# Patient Record
Sex: Male | Born: 1935 | Race: White | Hispanic: No | Smoking: Never smoker
Health system: Southern US, Community
[De-identification: ages and names within clinical notes are randomized; demographics above are authoritative.]

## PROBLEM LIST (undated history)

## (undated) DIAGNOSIS — E119 Type 2 diabetes mellitus without complications: Secondary | ICD-10-CM

## (undated) DIAGNOSIS — N400 Enlarged prostate without lower urinary tract symptoms: Secondary | ICD-10-CM

## (undated) DIAGNOSIS — N189 Chronic kidney disease, unspecified: Secondary | ICD-10-CM

## (undated) DIAGNOSIS — Z7902 Long term (current) use of antithrombotics/antiplatelets: Secondary | ICD-10-CM

## (undated) DIAGNOSIS — I10 Essential (primary) hypertension: Secondary | ICD-10-CM

## (undated) DIAGNOSIS — Z87442 Personal history of urinary calculi: Secondary | ICD-10-CM

## (undated) DIAGNOSIS — Z972 Presence of dental prosthetic device (complete) (partial): Secondary | ICD-10-CM

## (undated) DIAGNOSIS — I714 Abdominal aortic aneurysm, without rupture, unspecified: Secondary | ICD-10-CM

## (undated) DIAGNOSIS — R0600 Dyspnea, unspecified: Secondary | ICD-10-CM

## (undated) DIAGNOSIS — K08109 Complete loss of teeth, unspecified cause, unspecified class: Secondary | ICD-10-CM

## (undated) DIAGNOSIS — E785 Hyperlipidemia, unspecified: Secondary | ICD-10-CM

## (undated) DIAGNOSIS — I700 Atherosclerosis of aorta: Secondary | ICD-10-CM

## (undated) HISTORY — PX: CATARACT EXTRACTION W/ INTRAOCULAR LENS  IMPLANT, BILATERAL: SHX1307

## (undated) HISTORY — PX: TONSILLECTOMY: SUR1361

---

## 1958-07-09 HISTORY — PX: TONSILLECTOMY: SUR1361

## 2006-06-24 ENCOUNTER — Ambulatory Visit: Payer: Self-pay | Admitting: Urology

## 2006-07-31 ENCOUNTER — Ambulatory Visit: Payer: Self-pay | Admitting: Urology

## 2008-09-24 ENCOUNTER — Ambulatory Visit: Payer: Self-pay | Admitting: Unknown Physician Specialty

## 2008-10-07 ENCOUNTER — Ambulatory Visit: Payer: Self-pay | Admitting: Unknown Physician Specialty

## 2008-11-06 ENCOUNTER — Ambulatory Visit: Payer: Self-pay | Admitting: Unknown Physician Specialty

## 2008-11-17 ENCOUNTER — Ambulatory Visit: Payer: Self-pay | Admitting: Unknown Physician Specialty

## 2013-01-26 DIAGNOSIS — N138 Other obstructive and reflux uropathy: Secondary | ICD-10-CM | POA: Insufficient documentation

## 2013-01-26 DIAGNOSIS — D075 Carcinoma in situ of prostate: Secondary | ICD-10-CM | POA: Insufficient documentation

## 2013-01-26 DIAGNOSIS — R972 Elevated prostate specific antigen [PSA]: Secondary | ICD-10-CM | POA: Insufficient documentation

## 2014-01-18 DIAGNOSIS — E781 Pure hyperglyceridemia: Secondary | ICD-10-CM

## 2014-01-18 HISTORY — DX: Pure hyperglyceridemia: E78.1

## 2014-07-18 ENCOUNTER — Emergency Department: Payer: Self-pay | Admitting: Emergency Medicine

## 2014-07-21 DIAGNOSIS — N183 Chronic kidney disease, stage 3 unspecified: Secondary | ICD-10-CM | POA: Insufficient documentation

## 2014-07-21 DIAGNOSIS — M1611 Unilateral primary osteoarthritis, right hip: Secondary | ICD-10-CM | POA: Insufficient documentation

## 2015-10-20 DIAGNOSIS — I10 Essential (primary) hypertension: Secondary | ICD-10-CM | POA: Insufficient documentation

## 2016-10-28 DIAGNOSIS — G5603 Carpal tunnel syndrome, bilateral upper limbs: Secondary | ICD-10-CM | POA: Insufficient documentation

## 2016-11-05 ENCOUNTER — Encounter
Admission: RE | Admit: 2016-11-05 | Discharge: 2016-11-05 | Disposition: A | Discharge status: Home / Self Care | Payer: Medicare Other | Source: Ambulatory Visit | Attending: Unknown Physician Specialty | Admitting: Unknown Physician Specialty

## 2016-11-05 DIAGNOSIS — Z01818 Encounter for other preprocedural examination: Secondary | ICD-10-CM | POA: Insufficient documentation

## 2016-11-05 DIAGNOSIS — G5603 Carpal tunnel syndrome, bilateral upper limbs: Secondary | ICD-10-CM | POA: Insufficient documentation

## 2016-11-05 HISTORY — DX: Benign prostatic hyperplasia without lower urinary tract symptoms: N40.0

## 2016-11-05 HISTORY — DX: Essential (primary) hypertension: I10

## 2016-11-05 HISTORY — DX: Chronic kidney disease, unspecified: N18.9

## 2016-11-05 HISTORY — DX: Type 2 diabetes mellitus without complications: E11.9

## 2016-11-05 HISTORY — DX: Dyspnea, unspecified: R06.00

## 2016-11-05 HISTORY — DX: Hyperlipidemia, unspecified: E78.5

## 2016-11-05 NOTE — Pre-Procedure Instructions (Signed)
Clinical History:  83 y.o. year old male recent anginal symptoms  Vitals: Height: 62 in Weight: 170 lb  Cardiac risk factors include:    Hyperlipidemia, Diabetes, HTN and Family Hx CAD       Procedure:    Pharmacologic stress testing was performed with Regadenoson using a single   use 0.4mg /20ml (0.08 mg/ml) prefilled syringe intravenously infused as a   bolus dose. The stress test was stopped due to Infusion completion.Blood   pressure response was normal. The patient did not develop any symptoms   other than fatigue during the procedure.     Rest HR: 68bpm  Rest BP: 150/80mmHg  Max HR: 88bpm  Min BP: 140/12mmHg    Stress Test Administered by: Percell Boston, CMA    ECG Interpretation:  Rest ZOX:WRUEAV sinus rhythm, none  Stress WUJ:WJXBJY sinus rhythm,   Recovery NWG:NFAOZH sinus rhythm  ECG Interpretation:non-diagnostic due to pharmacologic testing.      Administrations This Visit  regadenoson (LEXISCAN) 0.4 mg/5 mL inj syringe 0.4 mg  Admin Date  10/17/2016 Action  Given Dose  0.4 mg Route  Intravenous Administered By  Riley Lam, RT (N)       technetium Tc36m sestamibi (CARDIOLITE) injection 28.68 millicurie  Admin Date  10/17/2016 Action  Given Dose  28.68 millicurie Route  Intravenous Administered By  Titus Dubin, CNMT       technetium Tc58m sestamibi (CARDIOLITE) injection 9.14 millicurie  Admin Date  10/17/2016 Action  Given Dose  9.14 millicurie Route  Intravenous Administered By  Riley Lam, RT (N)             Gated post-stress perfusion imaging was performed 30 minutes after stress.   Rest images were performed 30 minutes after injection.    Gated LV Analysis:   TID Ratio: 1.13    LVEF= 57 %    FINDINGS:  Regional wall motion:reveals normal myocardial thickening and wall   motion.  The overall quality of the study is good.   Artifacts noted: no  Left ventricular cavity: normal.    Perfusion Analysis:SPECT images demonstrate homogeneous tracer   distribution throughout the myocardium.     Back to top of Imaging Results   Echo complete (10/17/2016 8:54 AM) Echo complete (10/17/2016 8:54 AM)  Component Value Ref Range  LV Ejection Fraction (%) 50   Aortic Valve Regurgitation Grade none   Aortic Valve Stenosis Grade none   Aortic Valve Max Velocity (m/s) 1.3 m/sec  Aortic Valve Stenosis Mean Gradient (mmHg) 3.0 mmHg  Mitral Valve Regurgitation Grade trivial   Mitral Valve Stenosis Grade none   Tricuspid Valve Regurgitation Grade mild   Tricuspid Valve Regurgitation Max Velocity (m/s) 2.2 m/sec  Right Ventricle Systolic Pressure (mmHg) 24.7 mmHg  LV End Diastolic Diameter (cm) 4.5 cm  LV End Systolic Diameter (cm) 3.3 cm  LV Septum Wall Thickness (cm) 0.97 cm  LV Posterior Wall Thickness (cm) 0.98 cm  Left Atrium Diameter (cm) 4.3 cm   Echo complete (10/17/2016 8:54 AM)  Specimen Performing Laboratory   [facility] OTHER ORDERS    Echo complete (10/17/2016 8:54 AM)  Narrative  CARDIOLOGY DEPARTMENTFOUST, Rona Ravens, JR.   [facility] [id number]   [facility]: [id number]   [address] [place], [place] [address]NUUV: 10/17/2016 08:09 AM   Adult Male Age: 52 yrs  ECHOCARDIOGRAM REPORTOutpatient   KC::KCWC  STUDY:CHEST WALL TAPE:0000:00: 0:00:00 MD1:CALLWOOD, DWAYNE DENNIS   ECHO:Yes DOPPLER:YesFILE:0000-000-000 BP: 108/58 mmHg  COLOR:YesCONTRAST:No MACHINE:Philips  Height: 62 in  RV BIOPSY:No 3D:NoSOUND  QLTY:ModerateWeight: 170 lb   MEDIUM:None BSA: 1.8 m2    ___________________________________________________________________________________________   HISTORY:DOE, Chest pain  REASON:Assess, LV function  INDICATION:R06.02 Shortness of breath, I20.8 Other forms of angina pectoris  ___________________________________________________________________________________________    ECHOCARDIOGRAPHIC MEASUREMENTS  2D DIMENSIONS  AORTA ValuesNormal RangeMAIN PAValuesNormal Range  Annulus:nm* [2.3 - 2.9]PA Main:nm* [1.5 - 2.1]  Aorta Sin:nm* [3.1 - 3.7] RIGHT VENTRICLE  ST Junction:nm* [2.6 - 3.2]RV Base:3.6 cm[ < 4.2]  Asc.Aorta:nm* [2.6 - 3.4] RV Mid:nm* [ < 3.5]  LEFT VENTRICLERV Length:nm* [ < 8.6]  LVIDd:4.5 cm[4.2 - 5.9] INFERIOR VENA CAVA  LVIDs:3.3 cmMax. IVC:nm* [ <= 2.1]   FS:27.6 %[> 25]Min. IVC:nm*  SWT:0.97 cm [0.6 - 1.0] ------------------  PWT:0.98 cm [0.6 - 1.0] nm* - not measured  LEFT ATRIUM  LA Diam:4.3 cm[3.0 - 4.0]  LA A4C Area:nm* [ < 20]  LA Volume:nm* Vera.August - 58]  ___________________________________________________________________________________________    ECHOCARDIOGRAPHIC DESCRIPTIONS    AORTIC ROOT  Size:Normal  Dissection:INDETERM FOR DISSECTION    AORTIC VALVE  Leaflets:TricuspidMorphology:MILDLY THICKENED  Mobility:Fully mobile    LEFT VENTRICLE  Size:Normal  Anterior:Normal  Contraction:NormalLateral:Normal  Closest EF:50% (Estimated)Septal:Normal   LV Masses:No MassesApical:Normal   XBJ:YNWG Inferior:Normal   Posterior:Normal  Dias.FxClass:(Grade 1) relaxation abnormal, E/A reversal    MITRAL VALVE  Leaflets:Normal Mobility:Fully mobile  Morphology:Normal    LEFT ATRIUM  Size:MILDLY ENLARGED LA Masses:No masses   IA Septum:Normal IAS    MAIN PA  Size:Normal    PULMONIC VALVE  Morphology:Normal Mobility:Fully mobile    RIGHT VENTRICLE   RV Masses:No MassesSize:Normal   Free Wall:NormalContraction:Normal    TRICUSPID VALVE  Leaflets:Normal Mobility:Fully mobile  Morphology:Normal    RIGHT ATRIUM  Size:MILDLY ENLARGEDRA Other:None   RA Mass:No masses    PERICARDIUM   Fluid:No effusion    INFERIOR VENACAVA  Size:Normal Normal respiratory collapse    ____________________________________________________________________    DOPPLER ECHO and OTHER SPECIAL PROCEDURES   Aortic:No AR No AS  127.0 cm/sec peak vel 6.5 mmHg peak grad  3.0 mmHg mean grad2.2 cm^2 by DOPPLER     Mitral:TRIVIAL MRNo MS  3.4 cm^2 by DOPPLER  MV Inflow E Vel=51.8 cm/sec MV Annulus E'Vel=4.6 cm/sec  E/E'Ratio=11.3    Tricuspid:MILD TR No TS  222.0 cm/sec peak TR vel24.7 mmHg peak RV pressure    Pulmonary:TRIVIAL PRNo PS        ___________________________________________________________________________________________    INTERPRETATION  NORMAL LEFT VENTRICULAR SYSTOLIC  FUNCTION WITH AN ESTIMATED EF = 50-55 %  NORMAL RIGHT VENTRICULAR SYSTOLIC FUNCTION  MILD TRICUSPID VALVE INSUFFICIENCY  TRACE MITRAL VALVE INSUFFICIENCY  NO VALVULAR STENOSIS  MILD BIATRIAL ENLARGEMENT    ___________________________________________________________________________________________    Electronically signed by: Dorothyann Peng, MD on 10/18/2016 10:04 AM  Performed By: Luretha Murphy, RDCS, RVT  Ordering Physician: Dorothyann Peng  ___________________________________________________________________________________________    Echo complete (10/17/2016 8:54 AM)  Procedure Note  Interface, Text Results In - 10/18/2016 10:04 AM EDT  CARDIOLOGY DEPARTMENT Lomba, Rona Ravens, JR. [facility] CLINIC [id number] [facility] Acct #: [id number] [address] [place], [place] [address] Date: 10/17/2016 08:09 AM Adult Male Age: 45 yrs ECHOCARDIOGRAM REPORT Outpatient KC::KCWC STUDY:CHEST WALL TAPE:0000:00: 0:00:00 MD1: CALLWOOD, DWAYNE DENNIS ECHO:Yes DOPPLER:Yes FILE:0000-000-000 BP: 108/58 mmHg COLOR:Yes CONTRAST:No MACHINE:Philips Height: 62 in RV BIOPSY:No 3D:No SOUND QLTY:Moderate Weight: 170 lb MEDIUM:None BSA: 1.8 m2  ___________________________________________________________________________________________ HISTORY:DOE, Chest pain REASON:Assess, LV function INDICATION:R06.02 Shortness of breath, I20.8 Other forms of angina pectoris ___________________________________________________________________________________________  ECHOCARDIOGRAPHIC MEASUREMENTS  2D DIMENSIONS AORTA Values Normal Range MAIN PA Values  Normal Range Annulus: nm* [2.3 - 2.9] PA Main: nm*  [1.5 - 2.1] Aorta Sin: nm* [3.1 - 3.7] RIGHT VENTRICLE ST Junction: nm* [2.6 - 3.2] RV Base: 3.6 cm  [ < 4.2] Asc.Aorta: nm* [2.6 - 3.4] RV Mid: nm*  [ < 3.5] LEFT VENTRICLE RV Length: nm*  [ < 8.6] LVIDd: 4.5 cm [4.2 - 5.9] INFERIOR VENA CAVA LVIDs: 3.3 cm Max. IVC:  nm*  [ <= 2.1] FS: 27.6 % [> 25] Min. IVC: nm* SWT: 0.97 cm [0.6 - 1.0] ------------------ PWT: 0.98 cm [0.6 - 1.0] nm* - not measured LEFT ATRIUM LA Diam: 4.3 cm [3.0 - 4.0] LA A4C Area: nm* [ < 20] LA Volume: nm* [18 - 58] ___________________________________________________________________________________________  ECHOCARDIOGRAPHIC DESCRIPTIONS  AORTIC ROOT Size:Normal Dissection:INDETERM FOR DISSECTION  AORTIC VALVE Leaflets:Tricuspid Morphology:MILDLY THICKENED Mobility:Fully mobile  LEFT VENTRICLE Size:Normal Anterior:Normal Contraction:Normal Lateral:Normal Closest EF:50% (Estimated) Septal:Normal LV Masses:No Masses Apical:Normal ZOX:WRUE Inferior:Normal Posterior:Normal Dias.FxClass:(Grade 1) relaxation abnormal, E/A reversal  MITRAL VALVE Leaflets:Normal Mobility:Fully mobile Morphology:Normal  LEFT ATRIUM Size:MILDLY ENLARGED LA Masses:No masses IA Septum:Normal IAS  MAIN PA Size:Normal  PULMONIC VALVE Morphology:Normal Mobility:Fully mobile  RIGHT VENTRICLE RV Masses:No Masses Size:Normal Free Wall:Normal Contraction:Normal  TRICUSPID VALVE Leaflets:Normal Mobility:Fully mobile Morphology:Normal  RIGHT ATRIUM Size:MILDLY ENLARGED RA Other:None RA Mass:No masses  PERICARDIUM Fluid:No effusion  INFERIOR VENACAVA Size:Normal Normal respiratory collapse  ____________________________________________________________________  DOPPLER ECHO and OTHER SPECIAL PROCEDURES Aortic:No AR No AS 127.0 cm/sec peak vel 6.5 mmHg peak grad 3.0 mmHg mean grad 2.2 cm^2 by DOPPLER  Mitral:TRIVIAL MR No MS 3.4 cm^2 by DOPPLER MV Inflow E Vel=51.8 cm/sec MV Annulus E'Vel=4.6 cm/sec E/E'Ratio=11.3  Tricuspid:MILD TR No TS 222.0 cm/sec peak TR vel 24.7 mmHg peak RV pressure  Pulmonary:TRIVIAL PR No PS    ___________________________________________________________________________________________  INTERPRETATION NORMAL LEFT VENTRICULAR SYSTOLIC FUNCTION  WITH AN ESTIMATED EF = 50-55 % NORMAL RIGHT VENTRICULAR SYSTOLIC FUNCTION MILD TRICUSPID VALVE INSUFFICIENCY TRACE MITRAL VALVE INSUFFICIENCY NO VALVULAR STENOSIS MILD BIATRIAL ENLARGEMENT  ___________________________________________________________________________________________  Electronically signed by: Dorothyann Peng, MD on 10/18/2016 10:04 AM Performed By: Luretha Murphy, RDCS, RVT Ordering Physician: Dorothyann Peng ___________________________________________________________________________________________   Back to top of Imaging Results

## 2016-11-05 NOTE — Patient Instructions (Signed)
Your procedure is scheduled on: 11/12/16 Mon Report to Same Day Surgery 2nd floor medical mall [facility] Entrance-take elevator on left to 2nd floor.  Check in with surgery information desk.) To find out your arrival time please call [phone] between 1PM - 3PM on 11/09/16 Fri  Remember: Instructions that are not followed completely may result in serious medical risk, up to and including death, or upon the discretion of your surgeon and anesthesiologist your surgery may need to be rescheduled.    _x___ 1. Do not eat food or drink liquids after midnight. No gum chewing or                              hard candies.     __x__ 2. No Alcohol for 24 hours before or after surgery.   __x__3. No Smoking for 24 prior to surgery.   ____  4. Bring all medications with you on the day of surgery if instructed.    __x__ 5. Notify your doctor if there is any change in your medical condition     (cold, fever, infections).     Do not wear jewelry, make-up, hairpins, clips or nail polish.  Do not wear lotions, powders, or perfumes. You may wear deodorant.  Do not shave 48 hours prior to surgery. Men may shave face and neck.  Do not bring valuables to the hospital.    [facility] is not responsible for any belongings or valuables.               Contacts, dentures or bridgework may not be worn into surgery.  Leave your suitcase in the car. After surgery it may be brought to your room.  For patients admitted to the hospital, discharge time is determined by your                       treatment team.   Patients discharged the day of surgery will not be allowed to drive home.  You will need someone to drive you home and stay with you the night of your procedure.    Please read over the following fact sheets that you were given:   [facility] Preparing for Surgery and or MRSA Information   _x___ Take anti-hypertensive (unless it includes a diuretic), cardiac, seizure, asthma,     anti-reflux and  psychiatric medicines. These include:  1. lisinopril (PRINIVIL,ZESTRIL  2.fenofibrate (TRICOR  3.  4.  5.  6.  ____Fleets enema or Magnesium Citrate as directed.   _x___ Use CHG Soap or sage wipes as directed on instruction sheet   ____ Use inhalers on the day of surgery and bring to hospital day of surgery  ____ Stop Metformin and Janumet 2 days prior to surgery.    ____ Take 1/2 of usual insulin dose the night before surgery and none on the morning     surgery.   _x___ Follow recommendations from Cardiologist, Pulmonologist or PCP regarding          stopping Aspirin, Coumadin, Pllavix ,Eliquis, Effient, or Pradaxa, and Pletal.  X____Stop Anti-inflammatories such as Advil, Aleve, Ibuprofen, Motrin, Naproxen, Naprosyn, Goodies powders or aspirin products. OK to take Tylenol and                          Celebrex.   _x___ Stop supplements until after surgery.  But may continue Vitamin D,  Vitamin B,       and multivitamin.   ____ Bring C-Pap to the hospital.

## 2016-11-12 ENCOUNTER — Ambulatory Visit: Payer: Medicare Other | Admitting: Anesthesiology

## 2016-11-12 ENCOUNTER — Encounter: Payer: Self-pay | Admitting: *Deleted

## 2016-11-12 ENCOUNTER — Ambulatory Visit
Admission: RE | Admit: 2016-11-12 | Discharge: 2016-11-12 | Disposition: A | Discharge status: Home / Self Care | Payer: Medicare Other | Source: Ambulatory Visit | Attending: Unknown Physician Specialty | Admitting: Unknown Physician Specialty

## 2016-11-12 ENCOUNTER — Encounter
Admission: RE | Disposition: A | Discharge status: Home / Self Care | Payer: Self-pay | Source: Ambulatory Visit | Attending: Unknown Physician Specialty

## 2016-11-12 DIAGNOSIS — Z86010 Personal history of colonic polyps: Secondary | ICD-10-CM | POA: Diagnosis not present

## 2016-11-12 DIAGNOSIS — I10 Essential (primary) hypertension: Secondary | ICD-10-CM | POA: Diagnosis not present

## 2016-11-12 DIAGNOSIS — R0602 Shortness of breath: Secondary | ICD-10-CM | POA: Diagnosis not present

## 2016-11-12 DIAGNOSIS — Z87442 Personal history of urinary calculi: Secondary | ICD-10-CM | POA: Diagnosis not present

## 2016-11-12 DIAGNOSIS — Z800 Family history of malignant neoplasm of digestive organs: Secondary | ICD-10-CM | POA: Insufficient documentation

## 2016-11-12 DIAGNOSIS — G5601 Carpal tunnel syndrome, right upper limb: Secondary | ICD-10-CM | POA: Diagnosis not present

## 2016-11-12 DIAGNOSIS — Z7982 Long term (current) use of aspirin: Secondary | ICD-10-CM | POA: Insufficient documentation

## 2016-11-12 DIAGNOSIS — Z79899 Other long term (current) drug therapy: Secondary | ICD-10-CM | POA: Diagnosis not present

## 2016-11-12 DIAGNOSIS — N400 Enlarged prostate without lower urinary tract symptoms: Secondary | ICD-10-CM | POA: Diagnosis not present

## 2016-11-12 DIAGNOSIS — Z8249 Family history of ischemic heart disease and other diseases of the circulatory system: Secondary | ICD-10-CM | POA: Diagnosis not present

## 2016-11-12 DIAGNOSIS — E118 Type 2 diabetes mellitus with unspecified complications: Secondary | ICD-10-CM | POA: Diagnosis not present

## 2016-11-12 HISTORY — PX: CARPAL TUNNEL RELEASE: SHX101

## 2016-11-12 LAB — GLUCOSE, CAPILLARY
GLUCOSE-CAPILLARY: 204 mg/dL — AB (ref 65–99)
Glucose-Capillary: 154 mg/dL — ABNORMAL HIGH (ref 65–99)

## 2016-11-12 SURGERY — CARPAL TUNNEL RELEASE
Anesthesia: General | Laterality: Right | Wound class: Clean

## 2016-11-12 MED ORDER — PROPOFOL 500 MG/50ML IV EMUL
INTRAVENOUS | Status: AC
  Filled 2016-11-12: qty 50

## 2016-11-12 MED ORDER — FENTANYL CITRATE (PF) 100 MCG/2ML IJ SOLN: 25.0000 ug | INTRAMUSCULAR | Status: DC | PRN

## 2016-11-12 MED ORDER — FAMOTIDINE 20 MG PO TABS
ORAL_TABLET | ORAL | Status: AC
  Administered 2016-11-12: 20 mg via ORAL
  Filled 2016-11-12: qty 1

## 2016-11-12 MED ORDER — SODIUM CHLORIDE 0.9 % IV SOLN
INTRAVENOUS | Status: DC
  Administered 2016-11-12: 09:00:00 via INTRAVENOUS

## 2016-11-12 MED ORDER — NORCO 5-325 MG PO TABS: 1.0000 | ORAL_TABLET | Freq: Four times a day (QID) | ORAL | 0 refills | Status: DC | PRN

## 2016-11-12 MED ORDER — ONDANSETRON HCL 4 MG/2ML IJ SOLN: 4.0000 mg | Freq: Once | INTRAMUSCULAR | Status: DC | PRN

## 2016-11-12 MED ORDER — ONDANSETRON HCL 4 MG/2ML IJ SOLN
INTRAMUSCULAR | Status: DC | PRN
Start: 2016-11-12 — End: 2016-11-12
  Administered 2016-11-12: 4 mg via INTRAVENOUS

## 2016-11-12 MED ORDER — FAMOTIDINE 20 MG PO TABS
20.0000 mg | ORAL_TABLET | Freq: Once | ORAL | Status: AC
  Administered 2016-11-12: 20 mg via ORAL

## 2016-11-12 MED ORDER — BUPIVACAINE HCL (PF) 0.5 % IJ SOLN
INTRAMUSCULAR | Status: DC | PRN
  Administered 2016-11-12: 4 mL

## 2016-11-12 MED ORDER — PROPOFOL 10 MG/ML IV BOLUS
INTRAVENOUS | Status: DC | PRN
  Administered 2016-11-12: 150 mg via INTRAVENOUS

## 2016-11-12 MED ORDER — FENTANYL CITRATE (PF) 100 MCG/2ML IJ SOLN
INTRAMUSCULAR | Status: AC
  Filled 2016-11-12: qty 2

## 2016-11-12 MED ORDER — KETOROLAC TROMETHAMINE 30 MG/ML IJ SOLN
INTRAMUSCULAR | Status: AC
  Filled 2016-11-12: qty 1

## 2016-11-12 MED ORDER — PHENYLEPHRINE HCL 10 MG/ML IJ SOLN
INTRAMUSCULAR | Status: DC | PRN
  Administered 2016-11-12 (×4): 100 ug via INTRAVENOUS

## 2016-11-12 MED ORDER — ONDANSETRON HCL 4 MG/2ML IJ SOLN
INTRAMUSCULAR | Status: AC
  Filled 2016-11-12: qty 2

## 2016-11-12 MED ORDER — KETOROLAC TROMETHAMINE 30 MG/ML IJ SOLN
INTRAMUSCULAR | Status: DC | PRN
  Administered 2016-11-12: 30 mg via INTRAVENOUS

## 2016-11-12 MED ORDER — FENTANYL CITRATE (PF) 100 MCG/2ML IJ SOLN
INTRAMUSCULAR | Status: DC | PRN
  Administered 2016-11-12: 25 ug via INTRAVENOUS
  Administered 2016-11-12: 50 ug via INTRAVENOUS
  Administered 2016-11-12: 25 ug via INTRAVENOUS

## 2016-11-12 SURGICAL SUPPLY — 25 items
BANDAGE ELASTIC 2 LF NS (GAUZE/BANDAGES/DRESSINGS) ×3 IMPLANT
BNDG ESMARK 4X12 TAN STRL LF (GAUZE/BANDAGES/DRESSINGS) ×3 IMPLANT
CHLORAPREP W/TINT 26ML (MISCELLANEOUS) ×3 IMPLANT
CUFF TOURN 18 STER (MISCELLANEOUS) ×3 IMPLANT
ELECT REM PT RETURN 9FT ADLT (ELECTROSURGICAL) ×3
ELECTRODE REM PT RTRN 9FT ADLT (ELECTROSURGICAL) ×1 IMPLANT
GAUZE SPONGE 4X4 12PLY STRL (GAUZE/BANDAGES/DRESSINGS) ×3 IMPLANT
GLOVE BIO SURGEON STRL SZ7.5 (GLOVE) ×3 IMPLANT
GLOVE BIO SURGEON STRL SZ8 (GLOVE) ×6 IMPLANT
GLOVE BIOGEL M STRL SZ7.5 (GLOVE) ×3 IMPLANT
GLOVE INDICATOR 8.0 STRL GRN (GLOVE) ×3 IMPLANT
GOWN STRL REUS W/ TWL LRG LVL3 (GOWN DISPOSABLE) ×2 IMPLANT
GOWN STRL REUS W/TWL LRG LVL3 (GOWN DISPOSABLE) ×4
KIT RM TURNOVER STRD PROC AR (KITS) ×3 IMPLANT
NS IRRIG 500ML POUR BTL (IV SOLUTION) ×3 IMPLANT
PACK EXTREMITY ARMC (MISCELLANEOUS) ×3 IMPLANT
PADDING CAST 2X4YD ST (MISCELLANEOUS) ×2
PADDING CAST BLEND 2X4 STRL (MISCELLANEOUS) ×1 IMPLANT
SOL PREP PVP 2OZ (MISCELLANEOUS) ×3
SOLUTION PREP PVP 2OZ (MISCELLANEOUS) ×1 IMPLANT
SPLINT CAST 1 STEP 3X12 (MISCELLANEOUS) ×3 IMPLANT
STOCKINETTE STRL 4IN 9604848 (GAUZE/BANDAGES/DRESSINGS) ×3 IMPLANT
SUT ETHILON 4-0 (SUTURE) ×2
SUT ETHILON 4-0 FS2 18XMFL BLK (SUTURE) ×1
SUTURE ETHLN 4-0 FS2 18XMF BLK (SUTURE) ×1 IMPLANT

## 2016-11-12 NOTE — Discharge Instructions (Signed)
Elevation  Ice pack  RTC in about 2 weeks     AMBULATORY SURGERY  DISCHARGE INSTRUCTIONS   1) The drugs that you were given will stay in your system until tomorrow so for the next 24 hours you should not:  A) Drive an automobile B) Make any legal decisions C) Drink any alcoholic beverage   2) You may resume regular meals tomorrow.  Today it is better to start with liquids and gradually work up to solid foods.  You may eat anything you prefer, but it is better to start with liquids, then soup and crackers, and gradually work up to solid foods.   3) Please notify your doctor immediately if you have any unusual bleeding, trouble breathing, redness and pain at the surgery site, drainage, fever, or pain not relieved by medication.    4) Additional Instructions:        Please contact your physician with any problems or Same Day Surgery at [phone], Monday through Friday 6 am to 4 pm, or [place] [facility] Main number at [phone].

## 2016-11-12 NOTE — Op Note (Signed)
DATE OF SURGERY:  11/12/2016  PATIENT NAME:  Carlos [name] Couchman Jr.   DOB: Apr 26, 1931  MRN: [id number]  PRE-OPERATIVE DIAGNOSIS: Right carpal tunnel syndrome  POST-OPERATIVE DIAGNOSIS:  Same  PROCEDURE: Right carpal tunnel release  SURGEON: Dr. Erin SonsHarold Annison Birchard, Montez HagemanJr. M.D.  ANESTHESIA: Gen.   INDICATIONS FOR SURGERY: Carlos [name] Nolley Jr. is [name] 83 y.o. year old male with [name] long history of numbness and paresthesias in the right hand. Nerve conduction studies demonstrated findings consistent with severe  median nerve compression.The patient had not seen any significant improvement despite conservative nonsurgical intervention. After discussion of the risks and benefits of surgical intervention, the patient expressed understanding of the risks benefits and agreed with plans for carpal tunnel release.   PROCEDURE IN DETAIL: The patient was taken the operating room where satisfactory general anesthesia was achieved. [name] tourniquet was placed on the patient's right upper arm.The right hand and arm were prepped  and draped in the usual sterile fashion. [name] "time-out" was performed as per usual protocol. The hand and forearm were exsanguinated using an Esmarch and the tourniquet was inflated to 250 mmHg.  An incision was made just ulnar to the thenar palmar crease. Dissection was carried down through the palmar fascia to the transverse carpal ligament. The transverse carpal ligament was sharply incised, taking care to protect the underlying structures within the carpal tunnel. Complete release of the transverse carpal ligament was achieved. There was no evidence of [name] mass or proliferative synovitis within the carpal tunnel. The median nerve did appear to be slightly flattened. The wound was irrigated with saline. The tourniquet was released at this time. It had been up for about 8 minutes. Bleeding was controlled with digital pressure and coagulation cautery. I did inject the subcutaneous tissue of the wound with about  2-3 cc of 0.5% Marcaine without epinephrine. The skin was then re-approximated with interrupted sutures of #4-0 nylon. [name] sterile dressing was applied followed by application of [name] volar splint.  The patient was awakened and transferred to [name] stretcher bed.  The patient tolerated the procedure well and was transported to the PACU in stable condition. Blood loss was negligible.  Dr. Isidoro DonningHarold Crystol Walpole, Jr. M.D.

## 2016-11-12 NOTE — Anesthesia Postprocedure Evaluation (Signed)
Anesthesia Post Note  Patient: Carlos [name] A Spoelstra Jr.  Procedure(s) Performed: Procedure(s) (LRB): CARPAL TUNNEL RELEASE (Right)  Patient location during evaluation: PACU Anesthesia Type: General Level of consciousness: awake and alert Pain management: pain level controlled Vital Signs Assessment: post-procedure vital signs reviewed and stable Respiratory status: spontaneous breathing, nonlabored ventilation, respiratory function stable and patient connected to nasal cannula oxygen Cardiovascular status: blood pressure returned to baseline and stable Postop Assessment: no signs of nausea or vomiting Anesthetic complications: no     Last Vitals:  Vitals:   11/12/16 1054 11/12/16 1110  BP: 127/65 128/63  Pulse: (!) 56   Resp: 18   Temp: 36.3 C     Last Pain:  Vitals:   11/12/16 1020  TempSrc:   PainSc: Asleep                 [name]

## 2016-11-12 NOTE — Transfer of Care (Signed)
Immediate Anesthesia Transfer of Care Note  Patient: Carlos GoodellKerston A Bentley Jr.  Procedure(s) Performed: Procedure(s): CARPAL TUNNEL RELEASE (Right)  Patient Location: PACU  Anesthesia Type:General  Level of Consciousness: sedated and responds to stimulation  Airway & Oxygen Therapy: Patient Spontanous Breathing and Patient connected to face mask oxygen  Post-op Assessment: Report given to RN and Post -op Vital signs reviewed and stable  Post vital signs: Reviewed and stable  Last Vitals:  Vitals:   11/12/16 0819 11/12/16 1020  BP: 134/75 (!) 104/58  Pulse: 78 (!) 54  Resp: 20 12  Temp: 37 C 36.4 C    Last Pain:  Vitals:   11/12/16 1020  TempSrc:   PainSc: Asleep         Complications: No apparent anesthesia complications

## 2016-11-12 NOTE — Anesthesia Procedure Notes (Signed)
Procedure Name: LMA Insertion Date/Time: 11/12/2016 9:29 AM Performed by: Omer JackWEATHERLY, Mirranda Monrroy Pre-anesthesia Checklist: Patient identified, Patient being monitored, Timeout performed, Emergency Drugs available and Suction available Patient Re-evaluated:Patient Re-evaluated prior to inductionOxygen Delivery Method: Circle system utilized Preoxygenation: Pre-oxygenation with 100% oxygen Intubation Type: IV induction Ventilation: Mask ventilation without difficulty LMA: LMA inserted LMA Size: 4.0 Tube type: Oral Number of attempts: 1 Placement Confirmation: positive ETCO2 and breath sounds checked- equal and bilateral Tube secured with: Tape Dental Injury: Teeth and Oropharynx as per pre-operative assessment

## 2016-11-12 NOTE — Anesthesia Preprocedure Evaluation (Signed)
Anesthesia Evaluation  Patient identified by MRN, date of birth, ID band Patient awake    Reviewed: Allergy & Precautions, NPO status , Patient's Chart, lab work & pertinent test results, reviewed documented beta blocker date and time   Airway Mallampati: III  TM Distance: >3 FB     Dental  (+) Chipped   Pulmonary shortness of breath,           Cardiovascular hypertension, Pt. on medications      Neuro/Psych    GI/Hepatic   Endo/Other  diabetes, Type 2  Renal/GU Renal InsufficiencyRenal disease     Musculoskeletal   Abdominal   Peds  Hematology   Anesthesia Other Findings   Reproductive/Obstetrics                             Anesthesia Physical Anesthesia Plan  ASA: III  Anesthesia Plan: General   Post-op Pain Management:    Induction: Intravenous  Airway Management Planned: LMA  Additional Equipment:   Intra-op Plan:   Post-operative Plan:   Informed Consent: I have reviewed the patients History and Physical, chart, labs and discussed the procedure including the risks, benefits and alternatives for the proposed anesthesia with the patient or authorized representative who has indicated his/her understanding and acceptance.     Plan Discussed with: CRNA  Anesthesia Plan Comments:         Anesthesia Quick Evaluation

## 2016-11-12 NOTE — H&P (Signed)
  H and P reviewed. No changes. Uploaded at later date. 

## 2016-11-12 NOTE — Anesthesia Post-op Follow-up Note (Cosign Needed)
Anesthesia QCDR form completed.        

## 2017-08-05 ENCOUNTER — Other Ambulatory Visit: Payer: Self-pay

## 2017-08-05 ENCOUNTER — Encounter: Payer: Self-pay | Admitting: *Deleted

## 2017-08-09 ENCOUNTER — Encounter
Admission: RE | Disposition: A | Discharge status: Home / Self Care | Payer: Self-pay | Source: Ambulatory Visit | Attending: Unknown Physician Specialty

## 2017-08-09 ENCOUNTER — Ambulatory Visit: Payer: Medicare Other | Admitting: Anesthesiology

## 2017-08-09 ENCOUNTER — Ambulatory Visit
Admission: RE | Admit: 2017-08-09 | Discharge: 2017-08-09 | Disposition: A | Discharge status: Home / Self Care | Payer: Medicare Other | Source: Ambulatory Visit | Attending: Unknown Physician Specialty | Admitting: Unknown Physician Specialty

## 2017-08-09 DIAGNOSIS — M1990 Unspecified osteoarthritis, unspecified site: Secondary | ICD-10-CM | POA: Insufficient documentation

## 2017-08-09 DIAGNOSIS — Z9842 Cataract extraction status, left eye: Secondary | ICD-10-CM | POA: Diagnosis not present

## 2017-08-09 DIAGNOSIS — G5602 Carpal tunnel syndrome, left upper limb: Secondary | ICD-10-CM | POA: Diagnosis present

## 2017-08-09 DIAGNOSIS — E1122 Type 2 diabetes mellitus with diabetic chronic kidney disease: Secondary | ICD-10-CM | POA: Diagnosis not present

## 2017-08-09 DIAGNOSIS — Z7982 Long term (current) use of aspirin: Secondary | ICD-10-CM | POA: Diagnosis not present

## 2017-08-09 DIAGNOSIS — Z86010 Personal history of colonic polyps: Secondary | ICD-10-CM | POA: Diagnosis not present

## 2017-08-09 DIAGNOSIS — Z87442 Personal history of urinary calculi: Secondary | ICD-10-CM | POA: Diagnosis not present

## 2017-08-09 DIAGNOSIS — Z800 Family history of malignant neoplasm of digestive organs: Secondary | ICD-10-CM | POA: Diagnosis not present

## 2017-08-09 DIAGNOSIS — Z79899 Other long term (current) drug therapy: Secondary | ICD-10-CM | POA: Diagnosis not present

## 2017-08-09 DIAGNOSIS — N400 Enlarged prostate without lower urinary tract symptoms: Secondary | ICD-10-CM | POA: Diagnosis not present

## 2017-08-09 DIAGNOSIS — N183 Chronic kidney disease, stage 3 (moderate): Secondary | ICD-10-CM | POA: Insufficient documentation

## 2017-08-09 DIAGNOSIS — Z7984 Long term (current) use of oral hypoglycemic drugs: Secondary | ICD-10-CM | POA: Insufficient documentation

## 2017-08-09 DIAGNOSIS — E781 Pure hyperglyceridemia: Secondary | ICD-10-CM | POA: Diagnosis not present

## 2017-08-09 DIAGNOSIS — Z9889 Other specified postprocedural states: Secondary | ICD-10-CM | POA: Diagnosis not present

## 2017-08-09 DIAGNOSIS — Z8249 Family history of ischemic heart disease and other diseases of the circulatory system: Secondary | ICD-10-CM | POA: Insufficient documentation

## 2017-08-09 DIAGNOSIS — I129 Hypertensive chronic kidney disease with stage 1 through stage 4 chronic kidney disease, or unspecified chronic kidney disease: Secondary | ICD-10-CM | POA: Insufficient documentation

## 2017-08-09 HISTORY — PX: CARPAL TUNNEL RELEASE: SHX101

## 2017-08-09 HISTORY — DX: Presence of dental prosthetic device (complete) (partial): Z97.2

## 2017-08-09 LAB — GLUCOSE, CAPILLARY
GLUCOSE-CAPILLARY: 192 mg/dL — AB (ref 65–99)
Glucose-Capillary: 152 mg/dL — ABNORMAL HIGH (ref 65–99)

## 2017-08-09 SURGERY — CARPAL TUNNEL RELEASE
Anesthesia: General | Site: Hand | Laterality: Left | Wound class: Clean

## 2017-08-09 MED ORDER — OXYCODONE HCL 5 MG PO TABS: 5.0000 mg | ORAL_TABLET | Freq: Once | ORAL | Status: DC | PRN

## 2017-08-09 MED ORDER — OXYCODONE HCL 5 MG/5ML PO SOLN: 5.0000 mg | Freq: Once | ORAL | Status: DC | PRN

## 2017-08-09 MED ORDER — BUPIVACAINE HCL (PF) 0.5 % IJ SOLN
INTRAMUSCULAR | Status: DC | PRN
  Administered 2017-08-09: 6 mL

## 2017-08-09 MED ORDER — ONDANSETRON HCL 4 MG/2ML IJ SOLN
INTRAMUSCULAR | Status: DC | PRN
  Administered 2017-08-09: 4 mg via INTRAVENOUS

## 2017-08-09 MED ORDER — LIDOCAINE HCL (CARDIAC) 20 MG/ML IV SOLN
INTRAVENOUS | Status: DC | PRN
  Administered 2017-08-09: 20 mg via INTRATRACHEAL

## 2017-08-09 MED ORDER — LACTATED RINGERS IV SOLN
INTRAVENOUS | Status: DC
  Administered 2017-08-09: 08:00:00 via INTRAVENOUS

## 2017-08-09 MED ORDER — FENTANYL CITRATE (PF) 100 MCG/2ML IJ SOLN
INTRAMUSCULAR | Status: DC | PRN
  Administered 2017-08-09 (×2): 50 ug via INTRAVENOUS

## 2017-08-09 MED ORDER — PHENYLEPHRINE HCL 10 MG/ML IJ SOLN
INTRAMUSCULAR | Status: DC | PRN
  Administered 2017-08-09 (×3): 100 ug via INTRAVENOUS

## 2017-08-09 MED ORDER — NORCO 5-325 MG PO TABS: 1.0000 | ORAL_TABLET | Freq: Four times a day (QID) | ORAL | 0 refills | Status: DC | PRN

## 2017-08-09 MED ORDER — GLYCOPYRROLATE 0.2 MG/ML IJ SOLN
INTRAMUSCULAR | Status: DC | PRN
  Administered 2017-08-09: 0.1 mg via INTRAVENOUS

## 2017-08-09 MED ORDER — PROPOFOL 10 MG/ML IV BOLUS
INTRAVENOUS | Status: DC | PRN
  Administered 2017-08-09: 100 mg via INTRAVENOUS

## 2017-08-09 MED ORDER — DEXMEDETOMIDINE HCL 200 MCG/2ML IV SOLN
INTRAVENOUS | Status: DC | PRN
  Administered 2017-08-09 (×2): 4 ug via INTRAVENOUS

## 2017-08-09 MED ORDER — LACTATED RINGERS IV SOLN
INTRAVENOUS | Status: DC
  Administered 2017-08-09: 07:00:00 via INTRAVENOUS

## 2017-08-09 SURGICAL SUPPLY — 29 items
BANDAGE ELASTIC 2 LF NS (GAUZE/BANDAGES/DRESSINGS) ×3 IMPLANT
BNDG ESMARK 4X12 TAN STRL LF (GAUZE/BANDAGES/DRESSINGS) ×3 IMPLANT
BNDG STRETCH 4X75 STRL LF (GAUZE/BANDAGES/DRESSINGS) ×3 IMPLANT
COVER LIGHT HANDLE FLEXIBLE (MISCELLANEOUS) ×6 IMPLANT
CUFF TOURN SGL QUICK 18 (TOURNIQUET CUFF) ×3 IMPLANT
DURAPREP 26ML APPLICATOR (WOUND CARE) ×3 IMPLANT
ELECT REM PT RETURN 9FT ADLT (ELECTROSURGICAL) ×3
ELECTRODE REM PT RTRN 9FT ADLT (ELECTROSURGICAL) ×1 IMPLANT
GAUZE SPONGE 4X4 12PLY STRL (GAUZE/BANDAGES/DRESSINGS) ×3 IMPLANT
GLOVE BIO SURGEON STRL SZ7.5 (GLOVE) ×3 IMPLANT
GLOVE BIO SURGEON STRL SZ8 (GLOVE) ×9 IMPLANT
GLOVE INDICATOR 8.0 STRL GRN (GLOVE) ×6 IMPLANT
GOWN STRL REUS W/ TWL LRG LVL3 (GOWN DISPOSABLE) ×2 IMPLANT
GOWN STRL REUS W/TWL LRG LVL3 (GOWN DISPOSABLE) ×4
KIT RM TURNOVER STRD PROC AR (KITS) ×3 IMPLANT
LOOP VESSEL RED MINI 1.3X0.9 (MISCELLANEOUS) ×1 IMPLANT
LOOPS RED MINI 1.3MMX0.9MM (MISCELLANEOUS) ×2
NS IRRIG 500ML POUR BTL (IV SOLUTION) ×3 IMPLANT
PACK EXTREMITY ARMC (MISCELLANEOUS) ×3 IMPLANT
PADDING CAST 2X4YD ST (MISCELLANEOUS) ×2
PADDING CAST BLEND 2X4 STRL (MISCELLANEOUS) ×1 IMPLANT
SOL PREP PVP 2OZ (MISCELLANEOUS) ×3
SOLUTION PREP PVP 2OZ (MISCELLANEOUS) ×1 IMPLANT
SPLINT CAST 1 STEP 3X12 (MISCELLANEOUS) ×3 IMPLANT
STOCKINETTE 4X48 STRL (DRAPES) ×3 IMPLANT
STRAP BODY AND KNEE 60X3 (MISCELLANEOUS) ×3 IMPLANT
SUT ETHILON 4-0 (SUTURE) ×2
SUT ETHILON 4-0 FS2 18XMFL BLK (SUTURE) ×1
SUTURE ETHLN 4-0 FS2 18XMF BLK (SUTURE) ×1 IMPLANT

## 2017-08-09 NOTE — Op Note (Signed)
DATE OF SURGERY:  08/09/2017  PATIENT NAME:  Carlos [name] Ackers Jr.   DOB: June 22, 1931  MRN: [id number]  PRE-OPERATIVE DIAGNOSIS: Left carpal tunnel syndrome  POST-OPERATIVE DIAGNOSIS:  Same  PROCEDURE: Left carpal tunnel release  SURGEON: Dr. Erin SonsHarold Daiveon Markman, Montez HagemanJr. M.D.  ANESTHESIA: General   INDICATIONS FOR SURGERY: Carlos [name] Talamo Jr. is [name] 84 y.o. year old male with [name] long history of numbness and paresthesias in the left hand. Nerve conduction studies demonstrated findings consistent with left median nerve compression.The patient had not seen any significant improvement despite conservative nonsurgical intervention. After discussion of the risks and benefits of surgical intervention, the patient expressed understanding of the risks benefits and agreed with plans for carpal tunnel release.   PROCEDURE IN DETAIL: The patient was taken the operating room where satisfactory general anesthesia was achieved. [name] tourniquet was placed on the patient's left upper arm.The left hand and arm were prepped  and draped in the usual sterile fashion. [name] "time-out" was performed as per usual protocol. The hand and forearm were exsanguinated using an Esmarch and the tourniquet was inflated to 250 mmHg.  An incision was made just ulnar to the thenar palmar crease. Dissection was carried down through the palmar fascia to the transverse carpal ligament. The transverse carpal ligament was sharply incised, taking care to protect the underlying structures within the carpal tunnel. Complete release of the transverse carpal ligament was achieved. There was no evidence of [name] mass or proliferative synovitis within the carpal tunnel. The median nerve did appear to be slightly flattened. The wound was irrigated with saline. The tourniquet was released at this time. It had been up for about 9 minutes. Bleeding was controlled with digital pressure and coagulation cautery. I did inject the subcutaneous tissue of the wound with about 5 cc  of 0.5% Marcaine without epinephrine. The skin was then re-approximated with interrupted sutures of #4-0 nylon. [name] sterile dressing was applied followed by application of [name] volar splint.  The patient was awakened and transferred to [name] stretcher bed.  The patient tolerated the procedure well and was transported to the PACU in stable condition. Blood loss was negligible.  Dr. Isidoro DonningHarold Aeden Matranga, Jr. M.D.

## 2017-08-09 NOTE — Anesthesia Postprocedure Evaluation (Signed)
Anesthesia Post Note  Patient: Carlos GoodellKerston A Manus Jr.  Procedure(s) Performed: OPEN CARPAL TUNNEL RELEASE (Left Hand)  Patient location during evaluation: PACU Anesthesia Type: General Level of consciousness: awake and alert Pain management: pain level controlled Vital Signs Assessment: post-procedure vital signs reviewed and stable Respiratory status: spontaneous breathing and respiratory function stable Cardiovascular status: blood pressure returned to baseline Postop Assessment: no headache Anesthetic complications: no    Verner Cholunkle, III,  Ailani Governale D

## 2017-08-09 NOTE — Discharge Instructions (Signed)
General Anesthesia, Adult, Care After °These instructions provide you with information about caring for yourself after your procedure. Your health care provider may also give you more specific instructions. Your treatment has been planned according to current medical practices, but problems sometimes occur. Call your health care provider if you have any problems or questions after your procedure. °What can I expect after the procedure? °After the procedure, it is common to have: °· Vomiting. °· A sore throat. °· Mental slowness. ° °It is common to feel: °· Nauseous. °· Cold or shivery. °· Sleepy. °· Tired. °· Sore or achy, even in parts of your body where you did not have surgery. ° °Follow these instructions at home: °For at least 24 hours after the procedure: °· Do not: °? Participate in activities where you could fall or become injured. °? Drive. °? Use heavy machinery. °? Drink alcohol. °? Take sleeping pills or medicines that cause drowsiness. °? Make important decisions or sign legal documents. °? Take care of children on your own. °· Rest. °Eating and drinking °· If you vomit, drink water, juice, or soup when you can drink without vomiting. °· Drink enough fluid to keep your urine clear or pale yellow. °· Make sure you have little or no nausea before eating solid foods. °· Follow the diet recommended by your health care provider. °General instructions °· Have a responsible adult stay with you until you are awake and alert. °· Return to your normal activities as told by your health care provider. Ask your health care provider what activities are safe for you. °· Take over-the-counter and prescription medicines only as told by your health care provider. °· If you smoke, do not smoke without supervision. °· Keep all follow-up visits as told by your health care provider. This is important. °Contact a health care provider if: °· You continue to have nausea or vomiting at home, and medicines are not helpful. °· You  cannot drink fluids or start eating again. °· You cannot urinate after 8-12 hours. °· You develop a skin rash. °· You have fever. °· You have increasing redness at the site of your procedure. °Get help right away if: °· You have difficulty breathing. °· You have chest pain. °· You have unexpected bleeding. °· You feel that you are having a life-threatening or urgent problem. °This information is not intended to replace advice given to you by your health care provider. Make sure you discuss any questions you have with your health care provider. °Document Released: 10/01/2000 Document Revised: 11/28/2015 Document Reviewed: 06/09/2015 °Elsevier Interactive Patient Education © 2018 [employer] ° °Ice pack  ° °Elevation ° °RTC in about 2 weeks ° °

## 2017-08-09 NOTE — Anesthesia Procedure Notes (Signed)
Procedure Name: LMA Insertion Performed by: Hero Mccathern, CRNA Pre-anesthesia Checklist: Patient identified, Patient being monitored, Timeout performed, Emergency Drugs available and Suction available Patient Re-evaluated:Patient Re-evaluated prior to induction Oxygen Delivery Method: Circle system utilized Preoxygenation: Pre-oxygenation with 100% oxygen Induction Type: IV induction Ventilation: Mask ventilation without difficulty LMA: LMA inserted LMA Size: 4.0 Tube type: Oral Number of attempts: 1 Placement Confirmation: positive ETCO2 and breath sounds checked- equal and bilateral Tube secured with: Tape Dental Injury: Teeth and Oropharynx as per pre-operative assessment        

## 2017-08-09 NOTE — H&P (Signed)
  H and P reviewed. No changes. Uploaded at later date. 

## 2017-08-09 NOTE — Transfer of Care (Signed)
Immediate Anesthesia Transfer of Care Note  Patient: Carlos GoodellKerston A Guinta Jr.  Procedure(s) Performed: OPEN CARPAL TUNNEL RELEASE (Left Hand)  Patient Location: PACU  Anesthesia Type: General  Level of Consciousness: awake, alert  and patient cooperative  Airway and Oxygen Therapy: Patient Spontanous Breathing and Patient connected to supplemental oxygen  Post-op Assessment: Post-op Vital signs reviewed, Patient's Cardiovascular Status Stable, Respiratory Function Stable, Patent Airway and No signs of Nausea or vomiting  Post-op Vital Signs: Reviewed and stable  Complications: No apparent anesthesia complications

## 2017-08-09 NOTE — Anesthesia Preprocedure Evaluation (Addendum)
Anesthesia Evaluation  Patient identified by MRN, date of birth, ID band Patient awake    Reviewed: Allergy & Precautions, H&P , NPO status , Patient's Chart, lab work & pertinent test results  Airway Mallampati: II  TM Distance: >3 FB Neck ROM: full    Dental  (+) Edentulous Upper   Pulmonary shortness of breath,    Pulmonary exam normal breath sounds clear to auscultation       Cardiovascular hypertension, Pt. on medications Normal cardiovascular exam     Neuro/Psych    GI/Hepatic negative GI ROS,   Endo/Other  diabetes  Renal/GU      Musculoskeletal   Abdominal   Peds  Hematology negative hematology ROS (+)   Anesthesia Other Findings   Reproductive/Obstetrics negative OB ROS                            Anesthesia Physical Anesthesia Plan  ASA: II  Anesthesia Plan: General LMA   Post-op Pain Management:    Induction:   PONV Risk Score and Plan:   Airway Management Planned:   Additional Equipment:   Intra-op Plan:   Post-operative Plan:   Informed Consent: I have reviewed the patients History and Physical, chart, labs and discussed the procedure including the risks, benefits and alternatives for the proposed anesthesia with the patient or authorized representative who has indicated his/her understanding and acceptance.     Plan Discussed with:   Anesthesia Plan Comments:        Anesthesia Quick Evaluation

## 2017-11-04 DIAGNOSIS — N183 Chronic kidney disease, stage 3 unspecified: Secondary | ICD-10-CM | POA: Insufficient documentation

## 2018-04-04 DIAGNOSIS — Z96651 Presence of right artificial knee joint: Secondary | ICD-10-CM | POA: Insufficient documentation

## 2018-09-07 HISTORY — PX: LITHOTRIPSY: SUR834

## 2018-12-25 DIAGNOSIS — M1712 Unilateral primary osteoarthritis, left knee: Secondary | ICD-10-CM | POA: Insufficient documentation

## 2019-04-08 DIAGNOSIS — M791 Myalgia, unspecified site: Secondary | ICD-10-CM | POA: Insufficient documentation

## 2019-06-25 DIAGNOSIS — N211 Calculus in urethra: Secondary | ICD-10-CM | POA: Insufficient documentation

## 2019-08-13 DIAGNOSIS — N281 Cyst of kidney, acquired: Secondary | ICD-10-CM | POA: Insufficient documentation

## 2019-12-02 DIAGNOSIS — I714 Abdominal aortic aneurysm, without rupture, unspecified: Secondary | ICD-10-CM

## 2019-12-02 HISTORY — DX: Abdominal aortic aneurysm, without rupture, unspecified: I71.40

## 2019-12-08 HISTORY — PX: OTHER SURGICAL HISTORY: SHX169

## 2019-12-11 DIAGNOSIS — N431 Infected hydrocele: Secondary | ICD-10-CM | POA: Insufficient documentation

## 2020-01-14 DIAGNOSIS — I714 Abdominal aortic aneurysm, without rupture, unspecified: Secondary | ICD-10-CM | POA: Insufficient documentation

## 2020-02-08 DIAGNOSIS — R54 Age-related physical debility: Secondary | ICD-10-CM | POA: Insufficient documentation

## 2020-05-17 ENCOUNTER — Other Ambulatory Visit (HOSPITAL_COMMUNITY): Payer: Self-pay | Admitting: Internal Medicine

## 2020-05-17 ENCOUNTER — Other Ambulatory Visit: Payer: Self-pay | Admitting: Internal Medicine

## 2020-05-17 DIAGNOSIS — R0600 Dyspnea, unspecified: Secondary | ICD-10-CM

## 2020-05-17 DIAGNOSIS — I10 Essential (primary) hypertension: Secondary | ICD-10-CM

## 2020-05-17 DIAGNOSIS — R0609 Other forms of dyspnea: Secondary | ICD-10-CM

## 2020-05-24 ENCOUNTER — Ambulatory Visit: Admission: RE | Admit: 2020-05-24 | Payer: Medicare PPO | Source: Ambulatory Visit

## 2020-06-15 ENCOUNTER — Ambulatory Visit
Admission: RE | Admit: 2020-06-15 | Discharge: 2020-06-15 | Disposition: A | Discharge status: Home / Self Care | Payer: Medicare PPO | Source: Ambulatory Visit | Attending: Internal Medicine | Admitting: Internal Medicine

## 2020-06-15 ENCOUNTER — Other Ambulatory Visit: Payer: Self-pay

## 2020-06-15 DIAGNOSIS — I10 Essential (primary) hypertension: Secondary | ICD-10-CM | POA: Diagnosis present

## 2020-06-15 DIAGNOSIS — R0600 Dyspnea, unspecified: Secondary | ICD-10-CM | POA: Diagnosis not present

## 2020-06-15 DIAGNOSIS — R0609 Other forms of dyspnea: Secondary | ICD-10-CM

## 2020-06-15 LAB — NM MYOCAR MULTI W/SPECT W/WALL MOTION / EF
Estimated workload: 1 METS
Exercise duration (min): 1 min
Exercise duration (sec): 1 s
LV dias vol: 41 mL (ref 62–150)
LV sys vol: 14 mL
MPHR: 133 {beats}/min
Peak HR: 96 {beats}/min
Percent HR: 72 %
Rest HR: 71 {beats}/min
SDS: 0
SRS: 0
SSS: 0
TID: 0.77

## 2020-06-15 MED ORDER — TECHNETIUM TC 99M TETROFOSMIN IV KIT
10.0000 | PACK | Freq: Once | INTRAVENOUS | Status: AC | PRN
  Administered 2020-06-15: 10.241 via INTRAVENOUS

## 2020-06-15 MED ORDER — TECHNETIUM TC 99M TETROFOSMIN IV KIT
30.0000 | PACK | Freq: Once | INTRAVENOUS | Status: AC | PRN
  Administered 2020-06-15: 32.251 via INTRAVENOUS

## 2020-06-15 MED ORDER — REGADENOSON 0.4 MG/5ML IV SOLN
0.4000 mg | Freq: Once | INTRAVENOUS | Status: AC
  Administered 2020-06-15: 0.4 mg via INTRAVENOUS
  Filled 2020-06-15: qty 5

## 2021-01-08 NOTE — Discharge Instructions (Signed)
Instructions after Total Knee Replacement   Bryley Kovacevic P. Markos Theil, Jr., M.D.     Dept. of Orthopaedics & Sports Medicine  [facility]  [address]  [place], [place]  [address]  Phone: [phone]   Fax: [phone]    DIET: Drink plenty of non-alcoholic fluids. Resume your normal diet. Include foods high in fiber.  ACTIVITY:  You may use crutches or a walker with weight-bearing as tolerated, unless instructed otherwise. You may be weaned off of the walker or crutches by your Physical Therapist.  Do NOT place pillows under the knee. Anything placed under the knee could limit your ability to straighten the knee.   Continue doing gentle exercises. Exercising will reduce the pain and swelling, increase motion, and prevent muscle weakness.   Please continue to use the TED compression stockings for 6 weeks. You may remove the stockings at night, but should reapply them in the morning. Do not drive or operate any equipment until instructed.  WOUND CARE:  Continue to use the PolarCare or ice packs periodically to reduce pain and swelling. You may bathe or shower after the staples are removed at the first office visit following surgery.  MEDICATIONS: You may resume your regular medications. Please take the pain medication as prescribed on the medication. Do not take pain medication on an empty stomach. You have been given a prescription for a blood thinner (Lovenox or Coumadin). Please take the medication as instructed. (NOTE: After completing a 2 week course of Lovenox, take one Enteric-coated aspirin once a day. This along with elevation will help reduce the possibility of phlebitis in your operated leg.) Do not drive or drink alcoholic beverages when taking pain medications.  CALL THE OFFICE FOR: Temperature above 101 degrees Excessive bleeding or drainage on the dressing. Excessive swelling, coldness, or paleness of the toes. Persistent nausea and vomiting.  FOLLOW-UP:  You  should have an appointment to return to the office in 10-14 days after surgery. Arrangements have been made for continuation of Physical Therapy (either home therapy or outpatient therapy).   [facility] Department Directory         [email or web address]       [email or web address]          Cardiology  Appointments: [place] - [phone] [place] - [phone]  Endocrinology  Appointments: [place] - [phone] [place] - [phone]  Gastroenterology  Appointments: [place] - [phone] [place] - [phone]        General Surgery   Appointments: [place] - [phone]  Internal Medicine/Family Medicine  Appointments: [place] - [phone] [place] - [phone] [place] - [phone]  Metabolic and Weigh Loss Surgery  Appointments: [place] - [phone]        Neurology  Appointments: [place] - [phone] [place] - [phone]  Neurosurgery  Appointments: [place] - [phone]  Obstetrics & Gynecology  Appointments: [place] - [phone] [place] - [phone]        Pediatrics  Appointments: [place] - [phone] [place] - [phone]  Physiatry  Appointments: [place] -[phone]  Physical Therapy  Appointments: [place] - [phone] [place] - [phone]        Podiatry  Appointments: [place] - [phone] [place] - [phone]  Pulmonology  Appointments: [place] - [phone]  Rheumatology  Appointments: [place] - [phone]        [place] Location: [facility]  [address] , [place]  [address]  [place] Location: [facility] [address] [place], [place]  [address]  [place] Location: [facility] [address] [place], [place]  [address]    

## 2021-01-23 ENCOUNTER — Other Ambulatory Visit
Admission: RE | Admit: 2021-01-23 | Discharge: 2021-01-23 | Disposition: A | Discharge status: Home / Self Care | Payer: Medicare PPO | Source: Ambulatory Visit | Attending: Orthopedic Surgery | Admitting: Orthopedic Surgery

## 2021-01-23 ENCOUNTER — Other Ambulatory Visit: Payer: Self-pay

## 2021-01-23 DIAGNOSIS — Z01818 Encounter for other preprocedural examination: Secondary | ICD-10-CM | POA: Insufficient documentation

## 2021-01-23 HISTORY — DX: Personal history of urinary calculi: Z87.442

## 2021-01-23 HISTORY — DX: Abdominal aortic aneurysm, without rupture: I71.4

## 2021-01-23 HISTORY — DX: Abdominal aortic aneurysm, without rupture, unspecified: I71.40

## 2021-01-23 LAB — CBC
HCT: 41.5 % (ref 39.0–52.0)
Hemoglobin: 13.7 g/dL (ref 13.0–17.0)
MCH: 30.2 pg (ref 26.0–34.0)
MCHC: 33 g/dL (ref 30.0–36.0)
MCV: 91.4 fL (ref 80.0–100.0)
Platelets: 232 10*3/uL (ref 150–400)
RBC: 4.54 MIL/uL (ref 4.22–5.81)
RDW: 12.8 % (ref 11.5–15.5)
WBC: 7 10*3/uL (ref 4.0–10.5)
nRBC: 0 % (ref 0.0–0.2)

## 2021-01-23 LAB — URINALYSIS, ROUTINE W REFLEX MICROSCOPIC
Bilirubin Urine: NEGATIVE
Glucose, UA: NEGATIVE mg/dL
Hgb urine dipstick: NEGATIVE
Ketones, ur: NEGATIVE mg/dL
Nitrite: NEGATIVE
Protein, ur: NEGATIVE mg/dL
Specific Gravity, Urine: 1.02 (ref 1.005–1.030)
pH: 5 (ref 5.0–8.0)

## 2021-01-23 LAB — SURGICAL PCR SCREEN
MRSA, PCR: NEGATIVE
Staphylococcus aureus: NEGATIVE

## 2021-01-23 LAB — C-REACTIVE PROTEIN: CRP: 0.8 mg/dL (ref <1.0)

## 2021-01-23 LAB — HEMOGLOBIN A1C
Hgb A1c MFr Bld: 6.9 % — ABNORMAL HIGH (ref 4.8–5.6)
Mean Plasma Glucose: 151.33 mg/dL

## 2021-01-23 LAB — COMPREHENSIVE METABOLIC PANEL
ALT: 20 U/L (ref 0–44)
AST: 26 U/L (ref 15–41)
Albumin: 4.2 g/dL (ref 3.5–5.0)
Alkaline Phosphatase: 32 U/L — ABNORMAL LOW (ref 38–126)
Anion gap: 9 (ref 5–15)
BUN: 27 mg/dL — ABNORMAL HIGH (ref 8–23)
CO2: 24 mmol/L (ref 22–32)
Calcium: 9.4 mg/dL (ref 8.9–10.3)
Chloride: 105 mmol/L (ref 98–111)
Creatinine, Ser: 1.22 mg/dL (ref 0.61–1.24)
GFR, Estimated: 57 mL/min — ABNORMAL LOW (ref >60)
Glucose, Bld: 149 mg/dL — ABNORMAL HIGH (ref 70–99)
Potassium: 3.9 mmol/L (ref 3.5–5.1)
Sodium: 138 mmol/L (ref 135–145)
Total Bilirubin: 0.7 mg/dL (ref 0.3–1.2)
Total Protein: 6.7 g/dL (ref 6.5–8.1)

## 2021-01-23 LAB — TYPE AND SCREEN
ABO/RH(D): A POS
Antibody Screen: NEGATIVE

## 2021-01-23 LAB — SEDIMENTATION RATE: Sed Rate: 8 mm/hr (ref 0–20)

## 2021-01-23 NOTE — Patient Instructions (Addendum)
Your procedure is scheduled on: Wednesday February 01, 2021. Report to Day Surgery inside [facility] 2nd floor stop by admissions desk first before getting on elevator. To find out your arrival time please call [phone] between 1PM - 3PM on Tuesday January 31, 2021.  Remember: Instructions that are not followed completely may result in serious medical risk,  up to and including death, or upon the discretion of your surgeon and anesthesiologist your  surgery may need to be rescheduled.     _X__ 1. Do not eat food after midnight the night before your procedure.                 No chewing gum or hard candies. You may drink clear liquids up to 2 hours                 before you are scheduled to arrive for your surgery- DO not drink clear                 liquids within 2 hours of the start of your surgery.                 Clear Liquids include:  water, apple juice without pulp, clear Gatorade, G2 or                  Gatorade Zero (avoid Red/Purple/Blue), Black Coffee or Tea (Do not add                 anything to coffee or tea).  __X__2.   Complete the "Ensure Clear Pre-surgery Clear Carbohydrate Drink" provided to you, 2 hours before arrival. **If you are diabetic you will be provided with an alternative drink, Gatorade Zero or G2.  __X__3.  On the morning of surgery brush your teeth with toothpaste and water, you                may rinse your mouth with mouthwash if you wish.  Do not swallow any toothpaste of mouthwash.     _X__ 4.  No Alcohol for 24 hours before or after surgery.   _X__ 5.  Do Not Smoke or use e-cigarettes For 24 Hours Prior to Your Surgery.                 Do not use any chewable tobacco products for at least 6 hours prior to                 Surgery.  _X__  6.  Do not use any recreational drugs (marijuana, cocaine, heroin, ecstasy, MDMA or other)                For at least one week prior to your surgery.  Combination of these drugs with  anesthesia                May have life threatening results.  __X__  7.  Notify your doctor if there is any change in your medical condition      (cold, fever, infections).     Do not wear jewelry, make-up, hairpins, clips or nail polish. Do not wear lotions, powders, or perfumes. You may wear deodorant. Do not shave 48 hours prior to surgery. Men may shave face and neck. Do not bring valuables to the hospital.    [facility] is not responsible for any belongings or valuables.  Contacts, dentures or bridgework may not be worn into surgery. Leave your suitcase in the car. After  surgery it may be brought to your room. For patients admitted to the hospital, discharge time is determined by your treatment team.   Patients discharged the day of surgery will not be allowed to drive home.   Make arrangements for someone to be with you for the first 24 hours of your Same Day Discharge.    Please read over the following fact sheets that you were given:   Total Joint Packet    __X__ Take these medicines the morning of surgery with A SIP OF WATER:    1. dutasteride (AVODART) 0.5 MG  2. rosuvastatin (CRESTOR) 10 MG   3. fenofibrate (TRICOR) 145 MG  4. tamsulosin (FLOMAX) 0.4 MG   5.  6.  ____ Fleet Enema (as directed)   __X__ Use CHG Soap (or wipes) as directed  ____ Use Benzoyl Peroxide Gel as instructed  ____ Use inhalers on the day of surgery  ____ Stop metformin 2 days prior to surgery    ____ Take 1/2 of usual insulin dose the night before surgery. No insulin the morning          of surgery.   __X__ Stop aspirin 81 mg  Wednesday January 25, 2021 as instructed by his doctor.  __X__ One Week prior to surgery- Stop Anti-inflammatories such as Ibuprofen, Aleve, Advil, Motrin, meloxicam (MOBIC), diclofenac, etodolac, ketorolac, Toradol, Daypro, piroxicam, Goody's or BC powders. OK TO USE TYLENOL IF NEEDED   __X__ Stop vitamins 1 week prior to surgery as instructed.    ____  Bring C-Pap to the hospital.    If you have any questions regarding your pre-procedure instructions,  Please call Pre-admit Testing at [phone].

## 2021-01-24 LAB — URINE CULTURE
Culture: NO GROWTH
Special Requests: NORMAL

## 2021-01-30 ENCOUNTER — Other Ambulatory Visit: Payer: Self-pay

## 2021-01-30 ENCOUNTER — Other Ambulatory Visit
Admission: RE | Admit: 2021-01-30 | Discharge: 2021-01-30 | Disposition: A | Discharge status: Home / Self Care | Payer: Medicare PPO | Source: Ambulatory Visit | Attending: Orthopedic Surgery | Admitting: Orthopedic Surgery

## 2021-01-30 DIAGNOSIS — Z01812 Encounter for preprocedural laboratory examination: Secondary | ICD-10-CM | POA: Insufficient documentation

## 2021-01-30 DIAGNOSIS — Z20822 Contact with and (suspected) exposure to covid-19: Secondary | ICD-10-CM | POA: Insufficient documentation

## 2021-01-30 LAB — SARS CORONAVIRUS 2 (TAT 6-24 HRS): SARS Coronavirus 2: NEGATIVE

## 2021-01-31 NOTE — H&P (Signed)
ORTHOPAEDIC HISTORY & PHYSICAL Carlos [name], [place] - 01/20/2021 10:45 AM EDT Formatting of this note is different from the original. [name] [facility] Chief Complaint:   Chief Complaint  Patient presents with   Knee Pain  H & P LEFT KNEE   History of Present Illness:   [name]. is a 87 y.o. male that presents to clinic today for his preoperative history and evaluation. Patient presents with his daughter. The patient is scheduled to undergo a left total knee arthroplasty on 02/01/2021 by Dr. Marry Guan. His pain began approximately 1 year ago. The pain is located primarily along the medial aspect of the knee. He describes his pain as worse with standing and walking. He reports associated swelling with some giving way of the knee. He denies associated numbness or tingling, denies locking.   The patient's symptoms have progressed to the point that they decrease his quality of life. The patient has previously undergone conservative treatment including injections to the knee without adequate control of his symptoms. He is unable to tolerate NSAIDs due to chronic kidney disease.  Patient lives with his daughter and will have support after surgery. Denies history of blood clots, significant cardiac history, or history of lumbar surgery.  Patient is a type II diabetic. Last A1c on 08/10/2020 was 6.9.  Past Medical, Surgical, Family, Social History, Allergies, Medications:   Past Medical History:  Past Medical History:  Diagnosis Date   Abdominal aortic aneurysm (AAA) without rupture (CMS-HCC) 01/14/2020  3.2 cm by CT 2021 at [facility]; anticipate imaging in 3 years   Age-related physical debility 02/08/2020   Bilateral carpal tunnel syndrome 10/28/2016   CKD (chronic kidney disease) stage 3, GFR 30-59 ml/min (CMS-HCC) 07/21/2014   Controlled type 2 diabetes mellitus with stage 3 chronic kidney disease, without long-term current use of insulin (CMS-HCC)  07/23/2017   DJD (degenerative joint disease)   Essential hypertension 10/20/2015   History of BPH  and prostatitis   Hypertension   Hypertriglyceridemia 01/18/2014   Kidney stones   Myalgia 04/08/2019   Primary osteoarthritis of left knee 12/25/2018   Primary osteoarthritis of right hip 07/21/2014   Status post total knee replacement, right 04/04/2018   Type 2 diabetes mellitus (CMS-HCC) 01/18/2014   Type 2 diabetes mellitus with stage 3 chronic kidney disease, without long-term current use of insulin (CMS-HCC) 01/19/2016   Past Surgical History:  Past Surgical History:  Procedure Laterality Date   CATARACT EXTRACTION Left 02/2013   Colon polyps removed Bromide Right 11/12/2016   ENDOSCOPIC CARPAL TUNNEL RELEASE Left 08/2017   H/O prostate biopsy  which was normal   JOINT REPLACEMENT Right 03/31/2018  Total knee MAKO, Dr. Jefm Bryant   Lithotripsy for kidney stones 2008   REPLACEMENT TOTAL KNEE Right  in september   TONSILLECTOMY   Current Medications:  Current Outpatient Medications  Medication Sig Dispense Refill   ACCU-CHEK GUIDE TEST STRIPS test strip TEST BLOOD SUGAR EVERY DAY 50 strip 12   aspirin 81 MG EC tablet Take 81 mg by mouth once daily.   blood glucose meter kit Use as directed (ONE TOUCH) Dx E11.9 (Patient taking differently: Use as directed (Accu Chek) Dx E11.9) 1 each 0   dutasteride (AVODART) 0.5 mg capsule Take 1 capsule (0.5 mg total) by mouth once daily 90 capsule 1   fenofibrate nanocrystallized (TRICOR) 145 MG tablet TAKE 1 TABLET BY MOUTH ONCE DAILY. 90 tablet 1  folic acid/multivit-min/lutein (CENTRUM SILVER ORAL) Take 1 caplet by mouth once daily   glimepiride (AMARYL) 4 MG tablet TAKE 1 TABLET BY MOUTH DAILY WITH BREAKFAST AND 1/2 TABLET WITH LUNCH 135 tablet 1   JANUVIA 50 mg tablet TAKE 1 TABLET BY MOUTH EVERY DAY 90 tablet 3   lisinopriL (ZESTRIL) 20 MG tablet TAKE 1 TABLET BY MOUTH EVERY DAY 90 tablet 1   rosuvastatin  (CRESTOR) 10 MG tablet Take 1 tablet (10 mg total) by mouth once daily 90 tablet 3   tamsulosin (FLOMAX) 0.4 mg capsule Take 0.4 mg by mouth once daily Take 30 minutes after same meal each day. Before bedtime   lancets Use 1 each once daily Use as instructed. DX. E11.9 ONE TOUCH (Patient taking differently: Use 1 each once daily Use as instructed. DX. E11.9 Accu Chek ) 100 each 3   No current facility-administered medications for this visit.   Allergies: No Known Allergies  Social History:  Social History   Socioeconomic History   Marital status: Widowed   Number of children: 4   Years of education: 12   Highest education level: High school graduate  Occupational History   Occupation: Retired- Economist  Tobacco Use   Smoking status: Former Smoker   Smokeless tobacco: Former Counsellor Use: Never used  Substance and Sexual Activity   Alcohol use: No   Drug use: No   Sexual activity: Defer   Family History:  Family History  Problem Relation Age of Onset   Colon cancer Father   Coronary Artery Disease (Blocked arteries around heart) Father   Colon cancer Brother   No Known Problems Mother   Review of Systems:   A 10+ ROS was performed, reviewed, and the pertinent orthopaedic findings are documented in the HPI.   Physical Examination:   BP 116/70 (BP Location: Left upper arm, Patient Position: Sitting, BP Cuff Size: Adult)  Ht 157.5 cm (5' 2")  Wt 74.1 kg (163 lb 6.4 oz)  BMI 29.89 kg/m   Patient is a well-developed, well-nourished male in no acute distress. Patient has normal mood and affect. Patient is alert and oriented to person, place, and time.   HEENT: Atraumatic, normocephalic. Pupils equal and reactive to light. Extraocular motion intact. Noninjected sclera.  Cardiovascular: Regular rate and rhythm, with no murmurs, rubs, or gallops. Distal pulses palpable. No bruits.  Respiratory: Lungs clear to auscultation bilaterally.    Left Knee: Soft tissue swelling: mild Effusion: none Erythema: none Crepitance: mild Tenderness: medial Alignment: relative varus Mediolateral laxity: medial pseudolaxity Posterior sag: negative Patellar tracking: Good tracking without evidence of subluxation or tilt Atrophy: No significant atrophy.  Quadriceps tone was fair to good. Range of motion: 0/1/132 degrees  Sensation intact over the saphenous, lateral sural cutaneous, superficial fibular, and deep fibular nerve distributions.  Tests Performed/Reviewed:  X-rays  X-ray knee left 3 views  Result Date: 01/20/2021 3 views of the left knee were obtained. Images reveal moderate to severe loss of medial compartment joint space with osteophyte formation. No fractures or dislocations. No other osseous abnormality noted.   I personally ordered and interpreted today's x-rays.  Impression:   ICD-10-CM  1. Primary osteoarthritis of left knee M17.12   Plan:   The patient has end-stage degenerative changes of the left knee. It was explained to the patient that the condition is progressive in nature. Having failed conservative treatment, the patient has elected to proceed with a total joint arthroplasty. The  patient will undergo a total joint arthroplasty with Dr. Marry Guan. The risks of surgery, including blood clot and infection, were discussed with the patient. Measures to reduce these risks, including the use of anticoagulation, perioperative antibiotics, and early ambulation were discussed. The importance of postoperative physical therapy was discussed with the patient. The patient elects to proceed with surgery. The patient is instructed to stop all blood thinners prior to surgery. The patient is instructed to call the hospital the day before surgery to learn of the proper arrival time.   Contact our office with any questions or concerns. Follow up as indicated, or sooner should any new problems arise, if conditions worsen, or if  they are otherwise concerned.   Carlos [name], [place] [facility] [place], [place] [address] Phone: [phone]  This note was generated in part with voice recognition software and I apologize for any typographical errors that were not detected and corrected.  Electronically signed by Carlos Fudge, PA at 01/23/2021 3:27 PM EDT

## 2021-02-01 ENCOUNTER — Inpatient Hospital Stay: Payer: Medicare PPO

## 2021-02-01 ENCOUNTER — Encounter: Payer: Self-pay | Admitting: Orthopedic Surgery

## 2021-02-01 ENCOUNTER — Other Ambulatory Visit: Payer: Self-pay

## 2021-02-01 ENCOUNTER — Inpatient Hospital Stay
Admission: RE | Admit: 2021-02-01 | Discharge: 2021-02-02 | DRG: 470 | Disposition: A | Discharge status: Home / Self Care | Payer: Medicare PPO | Attending: Orthopedic Surgery | Admitting: Orthopedic Surgery

## 2021-02-01 ENCOUNTER — Inpatient Hospital Stay: Payer: Medicare PPO | Admitting: Certified Registered Nurse Anesthetist

## 2021-02-01 ENCOUNTER — Encounter
Admission: RE | Disposition: A | Discharge status: Home / Self Care | Payer: Self-pay | Source: Home / Self Care | Attending: Orthopedic Surgery

## 2021-02-01 DIAGNOSIS — N400 Enlarged prostate without lower urinary tract symptoms: Secondary | ICD-10-CM | POA: Diagnosis present

## 2021-02-01 DIAGNOSIS — Z8719 Personal history of other diseases of the digestive system: Secondary | ICD-10-CM

## 2021-02-01 DIAGNOSIS — Z20822 Contact with and (suspected) exposure to covid-19: Secondary | ICD-10-CM | POA: Diagnosis present

## 2021-02-01 DIAGNOSIS — M791 Myalgia, unspecified site: Secondary | ICD-10-CM | POA: Diagnosis present

## 2021-02-01 DIAGNOSIS — Z8249 Family history of ischemic heart disease and other diseases of the circulatory system: Secondary | ICD-10-CM | POA: Diagnosis not present

## 2021-02-01 DIAGNOSIS — I714 Abdominal aortic aneurysm, without rupture: Secondary | ICD-10-CM | POA: Diagnosis present

## 2021-02-01 DIAGNOSIS — N183 Chronic kidney disease, stage 3 unspecified: Secondary | ICD-10-CM | POA: Diagnosis present

## 2021-02-01 DIAGNOSIS — I129 Hypertensive chronic kidney disease with stage 1 through stage 4 chronic kidney disease, or unspecified chronic kidney disease: Secondary | ICD-10-CM | POA: Diagnosis present

## 2021-02-01 DIAGNOSIS — Z96659 Presence of unspecified artificial knee joint: Secondary | ICD-10-CM

## 2021-02-01 DIAGNOSIS — Z800 Family history of malignant neoplasm of digestive organs: Secondary | ICD-10-CM

## 2021-02-01 DIAGNOSIS — Z96651 Presence of right artificial knee joint: Secondary | ICD-10-CM | POA: Diagnosis present

## 2021-02-01 DIAGNOSIS — M1712 Unilateral primary osteoarthritis, left knee: Principal | ICD-10-CM | POA: Diagnosis present

## 2021-02-01 HISTORY — PX: KNEE ARTHROPLASTY: SHX992

## 2021-02-01 LAB — GLUCOSE, CAPILLARY
Glucose-Capillary: 176 mg/dL — ABNORMAL HIGH (ref 70–99)
Glucose-Capillary: 185 mg/dL — ABNORMAL HIGH (ref 70–99)
Glucose-Capillary: 274 mg/dL — ABNORMAL HIGH (ref 70–99)
Glucose-Capillary: 243 mg/dL — ABNORMAL HIGH (ref 70–99)
Glucose-Capillary: 208 mg/dL — ABNORMAL HIGH (ref 70–99)

## 2021-02-01 LAB — ABO/RH: ABO/RH(D): A POS

## 2021-02-01 SURGERY — ARTHROPLASTY, KNEE, TOTAL, USING IMAGELESS COMPUTER-ASSISTED NAVIGATION
Anesthesia: Spinal | Site: Knee | Laterality: Left

## 2021-02-01 MED ORDER — FERROUS SULFATE 325 (65 FE) MG PO TABS
325.0000 mg | ORAL_TABLET | Freq: Two times a day (BID) | ORAL | Status: DC
  Administered 2021-02-01 – 2021-02-02 (×3): 325 mg via ORAL
  Filled 2021-02-01 (×3): qty 1

## 2021-02-01 MED ORDER — TRANEXAMIC ACID-NACL 1000-0.7 MG/100ML-% IV SOLN
INTRAVENOUS | Status: AC
  Administered 2021-02-01: 1000 mg via INTRAVENOUS
  Filled 2021-02-01: qty 100

## 2021-02-01 MED ORDER — CEFAZOLIN SODIUM-DEXTROSE 2-4 GM/100ML-% IV SOLN
INTRAVENOUS | Status: AC
  Administered 2021-02-01: 2 g via INTRAVENOUS
  Filled 2021-02-01: qty 100

## 2021-02-01 MED ORDER — ONDANSETRON HCL 4 MG/2ML IJ SOLN: 4.0000 mg | Freq: Four times a day (QID) | INTRAMUSCULAR | Status: DC | PRN

## 2021-02-01 MED ORDER — PHENOL 1.4 % MT LIQD
1.0000 | OROMUCOSAL | Status: DC | PRN
  Filled 2021-02-01: qty 177

## 2021-02-01 MED ORDER — KETAMINE HCL 10 MG/ML IJ SOLN
INTRAMUSCULAR | Status: DC | PRN
  Administered 2021-02-01 (×3): 10 mg via INTRAVENOUS

## 2021-02-01 MED ORDER — FAMOTIDINE 20 MG PO TABS: 20.0000 mg | ORAL_TABLET | Freq: Once | ORAL | Status: AC

## 2021-02-01 MED ORDER — FENTANYL CITRATE (PF) 100 MCG/2ML IJ SOLN
INTRAMUSCULAR | Status: DC | PRN
  Administered 2021-02-01: 25 ug via INTRAVENOUS
  Administered 2021-02-01: 50 ug via INTRAVENOUS
  Administered 2021-02-01: 25 ug via INTRAVENOUS

## 2021-02-01 MED ORDER — DIPHENHYDRAMINE HCL 12.5 MG/5ML PO ELIX: 12.5000 mg | ORAL_SOLUTION | ORAL | Status: DC | PRN

## 2021-02-01 MED ORDER — CEFAZOLIN SODIUM-DEXTROSE 2-4 GM/100ML-% IV SOLN
INTRAVENOUS | Status: AC
  Filled 2021-02-01: qty 100

## 2021-02-01 MED ORDER — DEXAMETHASONE SODIUM PHOSPHATE 10 MG/ML IJ SOLN: 8.0000 mg | Freq: Once | INTRAMUSCULAR | Status: AC

## 2021-02-01 MED ORDER — ENOXAPARIN SODIUM 30 MG/0.3ML IJ SOSY
30.0000 mg | PREFILLED_SYRINGE | Freq: Two times a day (BID) | INTRAMUSCULAR | Status: DC
  Administered 2021-02-02: 30 mg via SUBCUTANEOUS
  Filled 2021-02-01: qty 0.3

## 2021-02-01 MED ORDER — TRANEXAMIC ACID-NACL 1000-0.7 MG/100ML-% IV SOLN
INTRAVENOUS | Status: AC
  Filled 2021-02-01: qty 100

## 2021-02-01 MED ORDER — ACETAMINOPHEN 10 MG/ML IV SOLN
1000.0000 mg | Freq: Four times a day (QID) | INTRAVENOUS | Status: AC
  Administered 2021-02-01 – 2021-02-02 (×4): 1000 mg via INTRAVENOUS
  Filled 2021-02-01 (×5): qty 100

## 2021-02-01 MED ORDER — CHLORHEXIDINE GLUCONATE 0.12 % MT SOLN: 15.0000 mL | Freq: Once | OROMUCOSAL | Status: AC

## 2021-02-01 MED ORDER — ADULT MULTIVITAMIN W/MINERALS CH
1.0000 | ORAL_TABLET | Freq: Every day | ORAL | Status: DC
  Administered 2021-02-01 – 2021-02-02 (×2): 1 via ORAL
  Filled 2021-02-01 (×2): qty 1

## 2021-02-01 MED ORDER — SODIUM CHLORIDE 0.9 % IV SOLN: INTRAVENOUS | Status: DC | PRN

## 2021-02-01 MED ORDER — CELECOXIB 200 MG PO CAPS
200.0000 mg | ORAL_CAPSULE | Freq: Two times a day (BID) | ORAL | Status: DC
  Administered 2021-02-01 – 2021-02-02 (×2): 200 mg via ORAL
  Filled 2021-02-01 (×2): qty 1

## 2021-02-01 MED ORDER — CHLORHEXIDINE GLUCONATE 0.12 % MT SOLN
OROMUCOSAL | Status: AC
  Administered 2021-02-01: 15 mL via OROMUCOSAL
  Filled 2021-02-01: qty 15

## 2021-02-01 MED ORDER — OXYCODONE HCL 5 MG PO TABS
10.0000 mg | ORAL_TABLET | ORAL | Status: DC | PRN
Start: 2021-02-01 — End: 2021-02-02

## 2021-02-01 MED ORDER — TRAMADOL HCL 50 MG PO TABS: 50.0000 mg | ORAL_TABLET | ORAL | Status: DC | PRN

## 2021-02-01 MED ORDER — CEFAZOLIN SODIUM-DEXTROSE 2-4 GM/100ML-% IV SOLN
2.0000 g | INTRAVENOUS | Status: AC
  Administered 2021-02-01: 2 g via INTRAVENOUS

## 2021-02-01 MED ORDER — GABAPENTIN 300 MG PO CAPS
ORAL_CAPSULE | ORAL | Status: AC
  Administered 2021-02-01: 300 mg via ORAL
  Filled 2021-02-01: qty 1

## 2021-02-01 MED ORDER — PROPOFOL 10 MG/ML IV BOLUS
INTRAVENOUS | Status: DC | PRN
  Administered 2021-02-01 (×2): 20 mg via INTRAVENOUS

## 2021-02-01 MED ORDER — BUPIVACAINE HCL (PF) 0.25 % IJ SOLN
INTRAMUSCULAR | Status: DC | PRN
  Administered 2021-02-01: 30 mL

## 2021-02-01 MED ORDER — GLIMEPIRIDE 2 MG PO TABS
2.0000 mg | ORAL_TABLET | Freq: Every day | ORAL | Status: DC
  Administered 2021-02-02: 2 mg via ORAL
  Filled 2021-02-01: qty 1

## 2021-02-01 MED ORDER — TRANEXAMIC ACID-NACL 1000-0.7 MG/100ML-% IV SOLN: 1000.0000 mg | Freq: Once | INTRAVENOUS | Status: AC

## 2021-02-01 MED ORDER — CHLORHEXIDINE GLUCONATE 4 % EX LIQD
60.0000 mL | Freq: Once | CUTANEOUS | Status: AC
  Administered 2021-02-01: 4 via TOPICAL

## 2021-02-01 MED ORDER — GLIMEPIRIDE 4 MG PO TABS
4.0000 mg | ORAL_TABLET | Freq: Every day | ORAL | Status: DC
  Administered 2021-02-02: 4 mg via ORAL
  Filled 2021-02-01 (×2): qty 1

## 2021-02-01 MED ORDER — CELECOXIB 200 MG PO CAPS
ORAL_CAPSULE | ORAL | Status: AC
  Administered 2021-02-01: 400 mg via ORAL
  Filled 2021-02-01: qty 2

## 2021-02-01 MED ORDER — GLIMEPIRIDE 2 MG PO TABS: 2.0000 mg | ORAL_TABLET | ORAL | Status: DC

## 2021-02-01 MED ORDER — GLYCOPYRROLATE 0.2 MG/ML IJ SOLN
INTRAMUSCULAR | Status: DC | PRN
  Administered 2021-02-01: .2 mg via INTRAVENOUS

## 2021-02-01 MED ORDER — ALUM & MAG HYDROXIDE-SIMETH 200-200-20 MG/5ML PO SUSP: 30.0000 mL | ORAL | Status: DC | PRN

## 2021-02-01 MED ORDER — SURGIPHOR WOUND IRRIGATION SYSTEM - OPTIME
TOPICAL | Status: DC | PRN
  Administered 2021-02-01: 1 via TOPICAL

## 2021-02-01 MED ORDER — FAMOTIDINE 20 MG PO TABS
ORAL_TABLET | ORAL | Status: AC
  Administered 2021-02-01: 20 mg via ORAL
  Filled 2021-02-01: qty 1

## 2021-02-01 MED ORDER — BUPIVACAINE HCL (PF) 0.5 % IJ SOLN
INTRAMUSCULAR | Status: DC | PRN
Start: 2021-02-01 — End: 2021-02-01
  Administered 2021-02-01: 3 mL via INTRATHECAL

## 2021-02-01 MED ORDER — FENTANYL CITRATE (PF) 100 MCG/2ML IJ SOLN: 25.0000 ug | INTRAMUSCULAR | Status: DC | PRN

## 2021-02-01 MED ORDER — OXYCODONE HCL 5 MG PO TABS: 5.0000 mg | ORAL_TABLET | ORAL | Status: DC | PRN

## 2021-02-01 MED ORDER — CELECOXIB 200 MG PO CAPS: 400.0000 mg | ORAL_CAPSULE | Freq: Once | ORAL | Status: AC

## 2021-02-01 MED ORDER — ONDANSETRON HCL 4 MG PO TABS: 4.0000 mg | ORAL_TABLET | Freq: Four times a day (QID) | ORAL | Status: DC | PRN

## 2021-02-01 MED ORDER — LINAGLIPTIN 5 MG PO TABS
5.0000 mg | ORAL_TABLET | Freq: Every day | ORAL | Status: DC
  Administered 2021-02-01 – 2021-02-02 (×2): 5 mg via ORAL
  Filled 2021-02-01 (×2): qty 1

## 2021-02-01 MED ORDER — SODIUM CHLORIDE 0.9 % IV SOLN
INTRAVENOUS | Status: DC | PRN
  Administered 2021-02-01: 30 ug/min via INTRAVENOUS

## 2021-02-01 MED ORDER — FLEET ENEMA 7-19 GM/118ML RE ENEM: 1.0000 | ENEMA | Freq: Once | RECTAL | Status: DC | PRN

## 2021-02-01 MED ORDER — TRANEXAMIC ACID-NACL 1000-0.7 MG/100ML-% IV SOLN
1000.0000 mg | INTRAVENOUS | Status: AC
  Administered 2021-02-01: 1000 mg via INTRAVENOUS

## 2021-02-01 MED ORDER — BISACODYL 10 MG RE SUPP: 10.0000 mg | Freq: Every day | RECTAL | Status: DC | PRN

## 2021-02-01 MED ORDER — CEFAZOLIN SODIUM-DEXTROSE 2-4 GM/100ML-% IV SOLN
2.0000 g | Freq: Four times a day (QID) | INTRAVENOUS | Status: AC
  Administered 2021-02-01: 2 g via INTRAVENOUS
  Filled 2021-02-01: qty 100

## 2021-02-01 MED ORDER — HYDROMORPHONE HCL 1 MG/ML IJ SOLN: 0.5000 mg | INTRAMUSCULAR | Status: DC | PRN

## 2021-02-01 MED ORDER — BUPIVACAINE HCL (PF) 0.5 % IJ SOLN
INTRAMUSCULAR | Status: AC
  Filled 2021-02-01: qty 10

## 2021-02-01 MED ORDER — DUTASTERIDE 0.5 MG PO CAPS
0.5000 mg | ORAL_CAPSULE | Freq: Every day | ORAL | Status: DC
  Administered 2021-02-02: 0.5 mg via ORAL
  Filled 2021-02-01: qty 1

## 2021-02-01 MED ORDER — INSULIN ASPART 100 UNIT/ML IJ SOLN
0.0000 [IU] | Freq: Three times a day (TID) | INTRAMUSCULAR | Status: DC
  Administered 2021-02-02 (×3): 3 [IU] via SUBCUTANEOUS
  Filled 2021-02-01 (×3): qty 1

## 2021-02-01 MED ORDER — INSULIN ASPART 100 UNIT/ML IJ SOLN
0.0000 [IU] | Freq: Every day | INTRAMUSCULAR | Status: DC
  Administered 2021-02-01: 2 [IU] via SUBCUTANEOUS
  Filled 2021-02-01: qty 1

## 2021-02-01 MED ORDER — TAMSULOSIN HCL 0.4 MG PO CAPS
0.4000 mg | ORAL_CAPSULE | Freq: Every day | ORAL | Status: DC
  Administered 2021-02-01 – 2021-02-02 (×2): 0.4 mg via ORAL
  Filled 2021-02-01 (×2): qty 1

## 2021-02-01 MED ORDER — ONDANSETRON HCL 4 MG/2ML IJ SOLN
INTRAMUSCULAR | Status: DC | PRN
  Administered 2021-02-01: 4 mg via INTRAVENOUS

## 2021-02-01 MED ORDER — GLYCOPYRROLATE 0.2 MG/ML IJ SOLN
INTRAMUSCULAR | Status: AC
  Filled 2021-02-01: qty 1

## 2021-02-01 MED ORDER — ROSUVASTATIN CALCIUM 10 MG PO TABS
10.0000 mg | ORAL_TABLET | Freq: Every day | ORAL | Status: DC
  Administered 2021-02-02: 10 mg via ORAL
  Filled 2021-02-01: qty 1

## 2021-02-01 MED ORDER — ACETAMINOPHEN 10 MG/ML IV SOLN
INTRAVENOUS | Status: DC | PRN
  Administered 2021-02-01: 1000 mg via INTRAVENOUS

## 2021-02-01 MED ORDER — 0.9 % SODIUM CHLORIDE (POUR BTL) OPTIME
TOPICAL | Status: DC | PRN
  Administered 2021-02-01: 1000 mL

## 2021-02-01 MED ORDER — MENTHOL 3 MG MT LOZG
1.0000 | LOZENGE | OROMUCOSAL | Status: DC | PRN
  Filled 2021-02-01: qty 9

## 2021-02-01 MED ORDER — ORAL CARE MOUTH RINSE: 15.0000 mL | Freq: Once | OROMUCOSAL | Status: AC

## 2021-02-01 MED ORDER — GABAPENTIN 300 MG PO CAPS
300.0000 mg | ORAL_CAPSULE | Freq: Once | ORAL | Status: AC
Start: 2021-02-01 — End: 2021-02-01

## 2021-02-01 MED ORDER — ONDANSETRON HCL 4 MG/2ML IJ SOLN
INTRAMUSCULAR | Status: AC
  Filled 2021-02-01: qty 2

## 2021-02-01 MED ORDER — PROPOFOL 1000 MG/100ML IV EMUL
INTRAVENOUS | Status: AC
  Filled 2021-02-01: qty 100

## 2021-02-01 MED ORDER — MAGNESIUM HYDROXIDE 400 MG/5ML PO SUSP
30.0000 mL | Freq: Every day | ORAL | Status: DC
  Administered 2021-02-01 – 2021-02-02 (×2): 30 mL via ORAL
  Filled 2021-02-01 (×2): qty 30

## 2021-02-01 MED ORDER — PHENYLEPHRINE HCL (PRESSORS) 10 MG/ML IV SOLN
INTRAVENOUS | Status: AC
  Filled 2021-02-01: qty 1

## 2021-02-01 MED ORDER — DEXAMETHASONE SODIUM PHOSPHATE 10 MG/ML IJ SOLN
INTRAMUSCULAR | Status: AC
  Administered 2021-02-01: 8 mg via INTRAVENOUS
  Filled 2021-02-01: qty 1

## 2021-02-01 MED ORDER — FENOFIBRATE 160 MG PO TABS
160.0000 mg | ORAL_TABLET | Freq: Every day | ORAL | Status: DC
  Administered 2021-02-02: 160 mg via ORAL
  Filled 2021-02-01: qty 1

## 2021-02-01 MED ORDER — METOCLOPRAMIDE HCL 10 MG PO TABS
10.0000 mg | ORAL_TABLET | Freq: Three times a day (TID) | ORAL | Status: DC
  Administered 2021-02-01 – 2021-02-02 (×4): 10 mg via ORAL
  Filled 2021-02-01 (×4): qty 1

## 2021-02-01 MED ORDER — ONDANSETRON HCL 4 MG/2ML IJ SOLN: 4.0000 mg | Freq: Once | INTRAMUSCULAR | Status: DC | PRN

## 2021-02-01 MED ORDER — KETAMINE HCL 50 MG/5ML IJ SOSY
PREFILLED_SYRINGE | INTRAMUSCULAR | Status: AC
  Filled 2021-02-01: qty 5

## 2021-02-01 MED ORDER — ACETAMINOPHEN 10 MG/ML IV SOLN
INTRAVENOUS | Status: AC
  Filled 2021-02-01: qty 100

## 2021-02-01 MED ORDER — SENNOSIDES-DOCUSATE SODIUM 8.6-50 MG PO TABS
1.0000 | ORAL_TABLET | Freq: Two times a day (BID) | ORAL | Status: DC
  Administered 2021-02-01 – 2021-02-02 (×2): 1 via ORAL
  Filled 2021-02-01 (×2): qty 1

## 2021-02-01 MED ORDER — PROPOFOL 500 MG/50ML IV EMUL
INTRAVENOUS | Status: DC | PRN
  Administered 2021-02-01: 100 ug/kg/min via INTRAVENOUS

## 2021-02-01 MED ORDER — PANTOPRAZOLE SODIUM 40 MG PO TBEC
40.0000 mg | DELAYED_RELEASE_TABLET | Freq: Two times a day (BID) | ORAL | Status: DC
  Administered 2021-02-01 – 2021-02-02 (×2): 40 mg via ORAL
  Filled 2021-02-01 (×2): qty 1

## 2021-02-01 MED ORDER — SODIUM CHLORIDE 0.9 % IV SOLN: INTRAVENOUS | Status: DC

## 2021-02-01 MED ORDER — ENSURE PRE-SURGERY PO LIQD
296.0000 mL | Freq: Once | ORAL | Status: DC
  Filled 2021-02-01: qty 296

## 2021-02-01 MED ORDER — LISINOPRIL 10 MG PO TABS
10.0000 mg | ORAL_TABLET | Freq: Every day | ORAL | Status: DC
  Administered 2021-02-01 – 2021-02-02 (×2): 10 mg via ORAL
  Filled 2021-02-01 (×2): qty 1

## 2021-02-01 MED ORDER — ACETAMINOPHEN 325 MG PO TABS: 325.0000 mg | ORAL_TABLET | Freq: Four times a day (QID) | ORAL | Status: DC | PRN

## 2021-02-01 MED ORDER — FENTANYL CITRATE (PF) 100 MCG/2ML IJ SOLN
INTRAMUSCULAR | Status: AC
  Filled 2021-02-01: qty 2

## 2021-02-01 SURGICAL SUPPLY — 74 items
ATTUNE MED DOME PAT 38 KNEE (Knees) ×1 IMPLANT
ATTUNE PS FEM LT SZ 5 CEM KNEE (Femur) ×1 IMPLANT
ATTUNE PSRP INSR SZ5 5 KNEE (Insert) ×1 IMPLANT
BASE TIBIAL ROT PLAT SZ 5 KNEE (Knees) IMPLANT
BATTERY INSTRU NAVIGATION (MISCELLANEOUS) ×8 IMPLANT
BLADE SAW 70X12.5 (BLADE) ×2 IMPLANT
BLADE SAW 90X13X1.19 OSCILLAT (BLADE) ×2 IMPLANT
BLADE SAW 90X25X1.19 OSCILLAT (BLADE) ×2 IMPLANT
BONE CEMENT GENTAMICIN (Cement) ×4 IMPLANT
CEMENT BONE GENTAMICIN 40 (Cement) IMPLANT
COOLER POLAR GLACIER W/PUMP (MISCELLANEOUS) ×2 IMPLANT
CUFF TOURN SGL QUICK 24 (TOURNIQUET CUFF)
CUFF TOURN SGL QUICK 34 (TOURNIQUET CUFF)
CUFF TRNQT CYL 24X4X16.5-23 (TOURNIQUET CUFF) IMPLANT
CUFF TRNQT CYL 34X4.125X (TOURNIQUET CUFF) IMPLANT
DRAPE 3/4 80X56 (DRAPES) ×2 IMPLANT
DRSG DERMACEA 8X12 NADH (GAUZE/BANDAGES/DRESSINGS) ×2 IMPLANT
DRSG MEPILEX SACRM 8.7X9.8 (GAUZE/BANDAGES/DRESSINGS) ×2 IMPLANT
DRSG OPSITE POSTOP 4X14 (GAUZE/BANDAGES/DRESSINGS) ×2 IMPLANT
DRSG TEGADERM 4X4.75 (GAUZE/BANDAGES/DRESSINGS) ×2 IMPLANT
DURAPREP 26ML APPLICATOR (WOUND CARE) ×4 IMPLANT
ELECT CAUTERY BLADE 6.4 (BLADE) ×2 IMPLANT
ELECT REM PT RETURN 9FT ADLT (ELECTROSURGICAL) ×2
ELECTRODE REM PT RTRN 9FT ADLT (ELECTROSURGICAL) ×1 IMPLANT
EX-PIN ORTHOLOCK NAV 4X150 (PIN) ×4 IMPLANT
GAUZE 4X4 16PLY ~~LOC~~+RFID DBL (SPONGE) ×2 IMPLANT
GLOVE SURG ENC MOIS LTX SZ7.5 (GLOVE) ×4 IMPLANT
GLOVE SURG ENC TEXT LTX SZ7.5 (GLOVE) ×4 IMPLANT
GLOVE SURG UNDER LTX SZ8 (GLOVE) ×2 IMPLANT
GLOVE SURG UNDER POLY LF SZ7.5 (GLOVE) ×2 IMPLANT
GOWN STRL REUS W/ TWL LRG LVL3 (GOWN DISPOSABLE) ×2 IMPLANT
GOWN STRL REUS W/ TWL XL LVL3 (GOWN DISPOSABLE) ×1 IMPLANT
GOWN STRL REUS W/TWL LRG LVL3 (GOWN DISPOSABLE) ×2
GOWN STRL REUS W/TWL XL LVL3 (GOWN DISPOSABLE) ×1
HEMOVAC 400CC 10FR (MISCELLANEOUS) ×2 IMPLANT
HOLDER FOLEY CATH W/STRAP (MISCELLANEOUS) ×2 IMPLANT
HOOD PEEL AWAY FLYTE STAYCOOL (MISCELLANEOUS) ×4 IMPLANT
IRRIGATION SURGIPHOR STRL (IV SOLUTION) ×2 IMPLANT
IV NS IRRIG 3000ML ARTHROMATIC (IV SOLUTION) ×2 IMPLANT
KIT TURNOVER KIT A (KITS) ×2 IMPLANT
KNIFE SCULPS 14X20 (INSTRUMENTS) ×2 IMPLANT
LABEL OR SOLS (LABEL) ×2 IMPLANT
MANIFOLD NEPTUNE II (INSTRUMENTS) ×4 IMPLANT
NDL SAFETY ECLIPSE 18X1.5 (NEEDLE) ×1 IMPLANT
NDL SPNL 20GX3.5 QUINCKE YW (NEEDLE) ×2 IMPLANT
NEEDLE HYPO 18GX1.5 SHARP (NEEDLE) ×1
NEEDLE SPNL 20GX3.5 QUINCKE YW (NEEDLE) ×4 IMPLANT
NS IRRIG 500ML POUR BTL (IV SOLUTION) ×2 IMPLANT
PACK TOTAL KNEE (MISCELLANEOUS) ×2 IMPLANT
PAD WRAPON POLAR KNEE (MISCELLANEOUS) ×1 IMPLANT
PENCIL SMOKE EVACUATOR COATED (MISCELLANEOUS) ×2 IMPLANT
PIN DRILL FIX HALF THREAD (BIT) ×4 IMPLANT
PIN FIXATION 1/8DIA X 3INL (PIN) ×2 IMPLANT
PULSAVAC PLUS IRRIG FAN TIP (DISPOSABLE) ×2
SOL PREP PVP 2OZ (MISCELLANEOUS) ×2
SOLUTION PREP PVP 2OZ (MISCELLANEOUS) ×1 IMPLANT
SPONGE DRAIN TRACH 4X4 STRL 2S (GAUZE/BANDAGES/DRESSINGS) ×2 IMPLANT
SPONGE T-LAP 18X18 ~~LOC~~+RFID (SPONGE) ×6 IMPLANT
STAPLER SKIN PROX 35W (STAPLE) ×2 IMPLANT
STOCKINETTE IMPERV 14X48 (MISCELLANEOUS) IMPLANT
STRAP TIBIA SHORT (MISCELLANEOUS) ×2 IMPLANT
SUCTION FRAZIER HANDLE 10FR (MISCELLANEOUS) ×1
SUCTION TUBE FRAZIER 10FR DISP (MISCELLANEOUS) ×1 IMPLANT
SUT VIC AB 0 CT1 36 (SUTURE) ×4 IMPLANT
SUT VIC AB 1 CT1 36 (SUTURE) ×4 IMPLANT
SUT VIC AB 2-0 CT2 27 (SUTURE) ×2 IMPLANT
SYR 20ML LL LF (SYRINGE) ×2 IMPLANT
SYR 30ML LL (SYRINGE) ×4 IMPLANT
TIBIAL BASE ROT PLAT SZ 5 KNEE (Knees) ×2 IMPLANT
TIP FAN IRRIG PULSAVAC PLUS (DISPOSABLE) ×1 IMPLANT
TOWEL OR 17X26 4PK STRL BLUE (TOWEL DISPOSABLE) ×2 IMPLANT
TOWER CARTRIDGE SMART MIX (DISPOSABLE) ×2 IMPLANT
TRAY FOLEY MTR SLVR 16FR STAT (SET/KITS/TRAYS/PACK) ×2 IMPLANT
WRAPON POLAR PAD KNEE (MISCELLANEOUS) ×2

## 2021-02-01 NOTE — H&P (Signed)
The patient has been re-examined, and the chart reviewed, and there have been no interval changes to the documented history and physical.    The risks, benefits, and alternatives have been discussed at length. The patient expressed understanding of the risks benefits and agreed with plans for surgical intervention.  Rashed Edler P. Iverna Hammac, Jr. M.D.    

## 2021-02-01 NOTE — Op Note (Signed)
OPERATIVE NOTE  DATE OF SURGERY:  02/01/2021  PATIENT NAME:  Carlos [name].   DOB: Mar 12, 1931  MRN: [id number]  PRE-OPERATIVE DIAGNOSIS: Degenerative arthrosis of the left knee, primary  POST-OPERATIVE DIAGNOSIS:  Same  PROCEDURE:  Left total knee arthroplasty using computer-assisted navigation  SURGEON:  Jena Gauss. M.D.  ASSISTANT: Baldwin Jamaica, PA-C (present and scrubbed throughout the case, critical for assistance with exposure, retraction, instrumentation, and closure)  ANESTHESIA: spinal  ESTIMATED BLOOD LOSS: 50 mL  FLUIDS REPLACED: 1000 mL of crystalloid  TOURNIQUET TIME: 84 minutes  DRAINS: 2 medium Hemovac drains  SOFT TISSUE RELEASES: Anterior cruciate ligament, posterior cruciate ligament, deep medial collateral ligament, patellofemoral ligament  IMPLANTS UTILIZED: DePuy Attune size 5 posterior stabilized femoral component (cemented), size 5 rotating platform tibial component (cemented), 38 mm medialized dome patella (cemented), and a 5 mm stabilized rotating platform polyethylene insert.  INDICATIONS FOR SURGERY: Carlos [name]. is a 87 y.o. year old male with a long history of progressive knee pain. X-rays demonstrated severe degenerative changes in tricompartmental fashion. The patient had not seen any significant improvement despite conservative nonsurgical intervention. After discussion of the risks and benefits of surgical intervention, the patient expressed understanding of the risks benefits and agree with plans for total knee arthroplasty.   The risks, benefits, and alternatives were discussed at length including but not limited to the risks of infection, bleeding, nerve injury, stiffness, blood clots, the need for revision surgery, cardiopulmonary complications, among others, and they were willing to proceed.  PROCEDURE IN DETAIL: The patient was brought into the operating room and, after adequate spinal anesthesia was achieved, a tourniquet was  placed on the patient's upper thigh. The patient's knee and leg were cleaned and prepped with alcohol and DuraPrep and draped in the usual sterile fashion. A "timeout" was performed as per usual protocol. The lower extremity was exsanguinated using an Esmarch, and the tourniquet was inflated to 300 mmHg. An anterior longitudinal incision was made followed by a standard mid vastus approach. The deep fibers of the medial collateral ligament were elevated in a subperiosteal fashion off of the medial flare of the tibia so as to maintain a continuous soft tissue sleeve. The patella was subluxed laterally and the patellofemoral ligament was incised. Inspection of the knee demonstrated severe degenerative changes with full-thickness loss of articular cartilage. Osteophytes were debrided using a rongeur. Anterior and posterior cruciate ligaments were excised. Two 4.0 mm Schanz pins were inserted in the femur and into the tibia for attachment of the array of trackers used for computer-assisted navigation. Hip center was identified using a circumduction technique. Distal landmarks were mapped using the computer. The distal femur and proximal tibia were mapped using the computer. The distal femoral cutting guide was positioned using computer-assisted navigation so as to achieve a 5 distal valgus cut. The femur was sized and it was felt that a size 5 femoral component was appropriate. A size 5 femoral cutting guide was positioned and the anterior cut was performed and verified using the computer. This was followed by completion of the posterior and chamfer cuts. Femoral cutting guide for the central box was then positioned in the center box cut was performed.  Attention was then directed to the proximal tibia. Medial and lateral menisci were excised. The extramedullary tibial cutting guide was positioned using computer-assisted navigation so as to achieve a 0 varus-valgus alignment and 3 posterior slope. The cut was  performed and verified using the computer. The proximal  tibia was sized and it was felt that a size 5 tibial tray was appropriate. Tibial and femoral trials were inserted followed by insertion of a 5 mm polyethylene insert. This allowed for excellent mediolateral soft tissue balancing both in flexion and in full extension. Finally, the patella was cut and prepared so as to accommodate a 38 mm medialized dome patella. A patella trial was placed and the knee was placed through a range of motion with excellent patellar tracking appreciated. The femoral trial was removed after debridement of posterior osteophytes. The central post-hole for the tibial component was reamed followed by insertion of a keel punch. Tibial trials were then removed. Cut surfaces of bone were irrigated with copious amounts of normal saline using pulsatile lavage and then suctioned dry. Polymethylmethacrylate cement with gentamicin was prepared in the usual fashion using a vacuum mixer. Cement was applied to the cut surface of the proximal tibia as well as along the undersurface of a size 5 rotating platform tibial component. Tibial component was positioned and impacted into place. Excess cement was removed using Personal assistant. Cement was then applied to the cut surfaces of the femur as well as along the posterior flanges of the size 5 femoral component. The femoral component was positioned and impacted into place. Excess cement was removed using Personal assistant. A 5 mm polyethylene trial was inserted and the knee was brought into full extension with steady axial compression applied. Finally, cement was applied to the backside of a 38 mm medialized dome patella and the patellar component was positioned and patellar clamp applied. Excess cement was removed using Personal assistant. After adequate curing of the cement, the tourniquet was deflated after a total tourniquet time of 84 minutes. Hemostasis was achieved using electrocautery. The knee was  irrigated with copious amounts of normal saline using pulsatile lavage followed by 500 ml of Surgiphor and then suctioned dry. 20 mL of 1.3% Exparel and 60 mL of 0.25% Marcaine in 40 mL of normal saline was injected along the posterior capsule, medial and lateral gutters, and along the arthrotomy site. A 5 mm stabilized rotating platform polyethylene insert was inserted and the knee was placed through a range of motion with excellent mediolateral soft tissue balancing appreciated and excellent patellar tracking noted. 2 medium drains were placed in the wound bed and brought out through separate stab incisions. The medial parapatellar portion of the incision was reapproximated using interrupted sutures of #1 Vicryl. Subcutaneous tissue was approximated in layers using first #0 Vicryl followed #2-0 Vicryl. The skin was approximated with skin staples. A sterile dressing was applied.  The patient tolerated the procedure well and was transported to the recovery room in stable condition.    Carlos [name]. Carlos [name]., M.D.

## 2021-02-01 NOTE — Transfer of Care (Signed)
Immediate Anesthesia Transfer of Care Note  Patient: Carlos [name].  Procedure(s) Performed: COMPUTER ASSISTED TOTAL KNEE ARTHROPLASTY (Left: Knee)  Patient Location: PACU  Anesthesia Type:Spinal  Level of Consciousness: drowsy  Airway & Oxygen Therapy: Patient Spontanous Breathing and Patient connected to face mask oxygen  Post-op Assessment: Report given to RN and Post -op Vital signs reviewed and stable  Post vital signs: Reviewed and stable  Last Vitals:  Vitals Value Taken Time  BP 105/67 02/01/21 1100  Temp    Pulse 86 02/01/21 1101  Resp 16 02/01/21 1101  SpO2 98 % 02/01/21 1101  Vitals shown include unvalidated device data.  Last Pain:  Vitals:   02/01/21 0631  TempSrc: Tympanic  PainSc: 0-No pain      Patients Stated Pain Goal: 0 (02/01/21 0631)  Complications: No notable events documented.

## 2021-02-01 NOTE — Evaluation (Signed)
Physical Therapy Evaluation Patient Details Name: Carlos [name]. MRN: [id number] DOB: 03-May-1931 Today's Date: 02/01/2021   History of Present Illness  Pt is an 87 yo M diagnosed with degenerative arthrosis of the left knee and is s/p elective TKA.  PMH includes: HTN, SOB with exertion, AAA, DM, and R TKA.   Clinical Impression  Pt was pleasant and motivated to participate during the session and overall performed very well during the session especially considering POD#0 status.  Pt required no physical assistance during the session and presented with no adverse symptoms other than mild L knee pain.  Pt was steady with transfers and gait with no LOB or L knee buckling and was able to achieve L knee AROM of 5-81 deg.  Pt is expected to make very good progress towards goals while in acute care and will benefit from HHPT upon discharge to safely address deficits listed in patient problem list for decreased caregiver assistance and eventual return to PLOF.     Follow Up Recommendations Home health PT;Supervision for mobility/OOB    Equipment Recommendations  None recommended by PT    Recommendations for Other Services       Precautions / Restrictions Precautions Precautions: Fall Restrictions Weight Bearing Restrictions: Yes LLE Weight Bearing: Weight bearing as tolerated Other Position/Activity Restrictions: Pt able to perform Ind L SLR, no KI required      Mobility  Bed Mobility Overal bed mobility: Modified Independent             General bed mobility comments: Extra time and effort only    Transfers Overall transfer level: Needs assistance Equipment used: Rolling walker (2 wheeled) Transfers: Sit to/from Stand Sit to Stand: Min guard         General transfer comment: Min verbal cues for sequencing with good eccentric and concentric control and stability  Ambulation/Gait Ambulation/Gait assistance: Min guard Gait Distance (Feet): 15 Feet Assistive device:  Rolling walker (2 wheeled) Gait Pattern/deviations: Step-to pattern;Decreased stance time - left Gait velocity: decreased   General Gait Details: Min verbal and visual cues for sequencing with good stability, no L knee buckling, and no adverse symptoms other than mild L knee pain  Stairs            Wheelchair Mobility    Modified Rankin (Stroke Patients Only)       Balance Overall balance assessment: Needs assistance Sitting-balance support: Feet unsupported Sitting balance-Leahy Scale: Good     Standing balance support: Bilateral upper extremity supported;During functional activity Standing balance-Leahy Scale: Good                               Pertinent Vitals/Pain Pain Assessment: 0-10 Pain Score: 3  Pain Location: L knee Pain Descriptors / Indicators: Sore Pain Intervention(s): Premedicated before session;Monitored during session;Repositioned    Home Living Family/patient expects to be discharged to:: Private residence Living Arrangements: Children;Other relatives Available Help at Discharge: Family;Available 24 hours/day Type of Home: Mobile home Home Access: Stairs to enter Entrance Stairs-Rails: Can reach both Entrance Stairs-Number of Steps: 3 Home Layout: One level Home Equipment: Walker - 2 wheels;Walker - 4 wheels;Bedside commode      Prior Function Level of Independence: Independent         Comments: Ind amb community distances without an AD, Ind with ADLs, 3 falls in the last 6 months due to tripping     Hand Dominance  Extremity/Trunk Assessment   Upper Extremity Assessment Upper Extremity Assessment: Overall WFL for tasks assessed    Lower Extremity Assessment Lower Extremity Assessment: Generalized weakness;LLE deficits/detail LLE Deficits / Details: BLE ankle strength and AROM and sensation to light touch WNL, L hip flex strength >/= 3/5 LLE: Unable to fully assess due to pain LLE Sensation: WNL        Communication   Communication: No difficulties  Cognition Arousal/Alertness: Awake/alert Behavior During Therapy: WFL for tasks assessed/performed Overall Cognitive Status: Within Functional Limits for tasks assessed                                        General Comments      Exercises Total Joint Exercises Ankle Circles/Pumps: AROM;Strengthening;Both;10 reps Quad Sets: AROM;Strengthening;Left;10 reps;5 reps Straight Leg Raises: AROM;Strengthening;Left;5 reps Long Arc Quad: AROM;Strengthening;Left;5 reps;10 reps Knee Flexion: 10 reps;5 reps;Left;Strengthening;AROM Goniometric ROM: L knee AROM: 5-81 deg Marching in Standing: AROM;Strengthening;Both;5 reps;Standing Other Exercises Other Exercises: HEP education per handout Other Exercises: Positioning education with pt and dtr to encourage L knee ext PROM and prevent heel pressure   Assessment/Plan    PT Assessment Patient needs continued PT services  PT Problem List Decreased strength;Decreased range of motion;Decreased activity tolerance;Decreased balance;Decreased mobility;Decreased knowledge of use of DME;Pain       PT Treatment Interventions DME instruction;Gait training;Stair training;Functional mobility training;Therapeutic activities;Therapeutic exercise;Balance training;Patient/family education    PT Goals (Current goals can be found in the Care Plan section)  Acute Rehab PT Goals Patient Stated Goal: To be able to mow the yard PT Goal Formulation: With patient Time For Goal Achievement: 02/14/21 Potential to Achieve Goals: Good    Frequency BID   Barriers to discharge        Co-evaluation               AM-PAC PT "6 Clicks" Mobility  Outcome Measure Help needed turning from your back to your side while in a flat bed without using bedrails?: A Little Help needed moving from lying on your back to sitting on the side of a flat bed without using bedrails?: A Little Help needed moving to  and from a bed to a chair (including a wheelchair)?: A Little Help needed standing up from a chair using your arms (e.g., wheelchair or bedside chair)?: A Little Help needed to walk in hospital room?: A Little Help needed climbing 3-5 steps with a railing? : A Lot 6 Click Score: 17    End of Session Equipment Utilized During Treatment: Gait belt Activity Tolerance: Patient tolerated treatment well Patient left: in chair;with call bell/phone within reach;with chair alarm set;with family/visitor present Nurse Communication: Mobility status;Weight bearing status PT Visit Diagnosis: Other abnormalities of gait and mobility (R26.89);Muscle weakness (generalized) (M62.81);Pain;History of falling (Z91.81) Pain - Right/Left: Left Pain - part of body: Knee    Time: 2703-5009 PT Time Calculation (min) (ACUTE ONLY): 40 min   Charges:   PT Evaluation $PT Eval Moderate Complexity: 1 Mod PT Treatments $Therapeutic Exercise: 8-22 mins $Therapeutic Activity: 8-22 mins        D. Elly Modena PT, DPT 02/01/21, 5:24 PM

## 2021-02-01 NOTE — Anesthesia Preprocedure Evaluation (Signed)
Anesthesia Evaluation  Patient identified by MRN, date of birth, ID band Patient awake    Reviewed: Allergy & Precautions, H&P , NPO status , Patient's Chart, lab work & pertinent test results, reviewed documented beta blocker date and time   Airway Mallampati: II   Neck ROM: full    Dental  (+) Poor Dentition   Pulmonary shortness of breath and with exertion,    Pulmonary exam normal        Cardiovascular Exercise Tolerance: Poor hypertension, On Medications negative cardio ROS Normal cardiovascular exam Rhythm:regular Rate:Normal     Neuro/Psych  Neuromuscular disease negative psych ROS   GI/Hepatic negative GI ROS, Neg liver ROS,   Endo/Other  negative endocrine ROSdiabetes, Well Controlled, Type 2, Oral Hypoglycemic Agents  Renal/GU Renal disease  negative genitourinary   Musculoskeletal   Abdominal   Peds  Hematology negative hematology ROS (+)   Anesthesia Other Findings Past Medical History: No date: AAA (abdominal aortic aneurysm) without rupture (HCC) No date: BPH (benign prostatic hyperplasia) No date: Chronic renal insufficiency No date: Diabetes mellitus without complication (HCC) No date: Dyspnea No date: Elevated lipids No date: History of kidney stones No date: Hypertension No date: Wears dentures     Comment:  full upper and lower Past Surgical History: 11/12/2016: CARPAL TUNNEL RELEASE; Right     Comment:  Procedure: CARPAL TUNNEL RELEASE;  Surgeon: Erin Sons, MD;  Location: [facility] OR[facility];  Service: Orthopedics;                Laterality: Right; 08/09/2017: CARPAL TUNNEL RELEASE; Left     Comment:  Procedure: OPEN CARPAL TUNNEL RELEASE;  Surgeon:               Erin Sons, MD;  Location: [facility];                Service: Orthopedics;  Laterality: Left;  Diabetic - oral              meds No date: CATARACT EXTRACTION W/ INTRAOCULAR LENS  IMPLANT,  BILATERAL 09/2018: LITHOTRIPSY     Comment:  at [facility] 12/2019: testical ; Right 1960: TONSILLECTOMY BMI    Body Mass Index: 29.84 kg/m     Reproductive/Obstetrics negative OB ROS                             Anesthesia Physical Anesthesia Plan  ASA: 3  Anesthesia Plan: Spinal   Post-op Pain Management:    Induction:   PONV Risk Score and Plan: 3  Airway Management Planned:   Additional Equipment:   Intra-op Plan:   Post-operative Plan:   Informed Consent: I have reviewed the patients History and Physical, chart, labs and discussed the procedure including the risks, benefits and alternatives for the proposed anesthesia with the patient or authorized representative who has indicated his/her understanding and acceptance.     Dental Advisory Given  Plan Discussed with: CRNA  Anesthesia Plan Comments:         Anesthesia Quick Evaluation

## 2021-02-01 NOTE — Anesthesia Procedure Notes (Signed)
Spinal  Patient location during procedure: OR Start time: 02/01/2021 7:20 AM End time: 02/01/2021 7:25 AM Reason for block: surgical anesthesia Staffing Performed: resident/CRNA  Anesthesiologist: Molli Barrows, MD Resident/CRNA: Norm Salt, CRNA Preanesthetic Checklist Completed: patient identified, IV checked, site marked, risks and benefits discussed, surgical consent, monitors and equipment checked and pre-op evaluation Spinal Block Patient position: sitting Prep: ChloraPrep Patient monitoring: heart rate, continuous pulse ox and blood pressure Approach: midline Location: L3-4 Injection technique: single-shot Needle Needle type: Pencan  Needle gauge: 24 G Needle length: 10 cm Assessment Events: CSF return Additional Notes IV functioning, monitors applied to pt. Expiration date of kit checked and confirmed to be in date. Sterile prep and drape, hand hygiene and sterile gloved used. Pt was positioned and spine was prepped in sterile fashion. Skin was anesthetized with lidocaine. Free flow of clear CSF obtained prior to injecting local anesthetic into CSF x 1 attempt. Spinal needle aspirated freely following injection. Needle was carefully withdrawn, and pt tolerated procedure well. Loss of motor and sensory on exam post injection.

## 2021-02-02 ENCOUNTER — Encounter: Payer: Self-pay | Admitting: Orthopedic Surgery

## 2021-02-02 LAB — GLUCOSE, CAPILLARY
Glucose-Capillary: 167 mg/dL — ABNORMAL HIGH (ref 70–99)
Glucose-Capillary: 156 mg/dL — ABNORMAL HIGH (ref 70–99)
Glucose-Capillary: 178 mg/dL — ABNORMAL HIGH (ref 70–99)

## 2021-02-02 MED ORDER — TRAMADOL HCL 50 MG PO TABS: 50.0000 mg | ORAL_TABLET | ORAL | 0 refills | Status: DC | PRN

## 2021-02-02 MED ORDER — ENOXAPARIN SODIUM 40 MG/0.4ML IJ SOSY: 40.0000 mg | PREFILLED_SYRINGE | INTRAMUSCULAR | 0 refills | Status: DC

## 2021-02-02 MED ORDER — CELECOXIB 200 MG PO CAPS: 200.0000 mg | ORAL_CAPSULE | Freq: Two times a day (BID) | ORAL | 0 refills | Status: DC

## 2021-02-02 NOTE — Progress Notes (Signed)
Patient discharged home per MD orders at this time.All discharge instructions,medications and education reviewed with patient and daughter at bedside.Pt expressed understanding and will comply with dc instructions.follow up appointments also communicated to Pt.no verbal c/o or any ssx of distress.patient discharged home with HH/PT services per order.patient was transported home by daughter in a private car.

## 2021-02-02 NOTE — Progress Notes (Signed)
Physical Therapy Treatment Patient Details Name: Carlos [name]. MRN: [id number] DOB: September 05, 1930 Today's Date: 02/02/2021    History of Present Illness Pt is an 87 yo M diagnosed with degenerative arthrosis of the left knee and is s/p elective TKA.  PMH includes: HTN, SOB with exertion, AAA, DM, and R TKA.    PT Comments    Pt was pleasant and motivated to participate during the session. Pt gave good effort throughout session. He performed all functional mobility with supervision-min guard with min verbal cues. Transfers are performed with good eccentric/concentric control from non-elevated surfaces. Pt encouraged to decrease weightbearing through UEs while ambulating and he remained steady with no LOB or left knee buckling. Pt will benefit from HHPT upon discharge to safely address deficits listed in patient problem list for decreased caregiver assistance and eventual return to PLOF.     Follow Up Recommendations  Home health PT;Supervision for mobility/OOB     Equipment Recommendations  None recommended by PT    Recommendations for Other Services       Precautions / Restrictions Precautions Precautions: Fall Restrictions Weight Bearing Restrictions: Yes LLE Weight Bearing: Weight bearing as tolerated Other Position/Activity Restrictions: Pt able to perform Ind L SLR, no KI required    Mobility  Bed Mobility Overal bed mobility: Modified Independent             General bed mobility comments: Bed rails used to perform supine>sit.    Transfers Overall transfer level: Needs assistance Equipment used: Rolling walker (2 wheeled) Transfers: Sit to/from Stand Sit to Stand: Supervision         General transfer comment: SBA, no difficulty noted from non-elevated surface; good eccentric/concentric control  Ambulation/Gait Ambulation/Gait assistance: Min guard Gait Distance (Feet): 200 Feet Assistive device: Rolling walker (2 wheeled) Gait Pattern/deviations:  Step-through pattern Gait velocity: decreased   General Gait Details: Verbal cues to reduce weight bearing through UEs as able. Pt was steady overall with no LOB.   Stairs Stairs: Yes Stairs assistance: Min guard Stair Management: Two rails;Forwards Number of Stairs: 4 General stair comments: Ascended/descended 4 steps. Verbal cues and demonstration for sequencing. Good eccentric/concentric control.   Wheelchair Mobility    Modified Rankin (Stroke Patients Only)       Balance Overall balance assessment: Needs assistance Sitting-balance support: No upper extremity supported;Feet supported Sitting balance-Leahy Scale: Good     Standing balance support: Bilateral upper extremity supported Standing balance-Leahy Scale: Good Standing balance comment: Reliance on UE support through RW                            Cognition Arousal/Alertness: Awake/alert Behavior During Therapy: WFL for tasks assessed/performed Overall Cognitive Status: Within Functional Limits for tasks assessed                                        Exercises Total Joint Exercises Marching in Standing: AROM;Strengthening;Both;10 reps;Standing Other Exercises Other Exercises: Static standing 1-2 min with min guard for improved activity tolerance. Other Exercises: Static sitting 1-68min x2 with supervision for improved trunk control and balance.     General Comments        Pertinent Vitals/Pain Pain Assessment: No/denies pain    Home Living Family/patient expects to be discharged to:: Private residence Living Arrangements: Children;Other relatives Available Help at Discharge: Family;Available 24 hours/day Type of Home: Mobile  home Home Access: Stairs to enter Entrance Stairs-Rails: Can reach both Home Layout: One level Home Equipment: Walker - 2 wheels;Walker - 4 wheels;Adaptive equipment Additional Comments: has rusted old BSC that he borrowed, pt/family don't feel safe  using    Prior Function Level of Independence: Independent      Comments: Ind amb community distances without an AD, Ind with ADLs, 3 falls in the last 6 months due to tripping   PT Goals (current goals can now be found in the care plan section) Acute Rehab PT Goals Patient Stated Goal: To be able to mow the yard Progress towards PT goals: Progressing toward goals    Frequency    BID      PT Plan Current plan remains appropriate    Co-evaluation              AM-PAC PT "6 Clicks" Mobility   Outcome Measure  Help needed turning from your back to your side while in a flat bed without using bedrails?: None Help needed moving from lying on your back to sitting on the side of a flat bed without using bedrails?: A Little Help needed moving to and from a bed to a chair (including a wheelchair)?: A Little Help needed standing up from a chair using your arms (e.g., wheelchair or bedside chair)?: A Little Help needed to walk in hospital room?: A Little Help needed climbing 3-5 steps with a railing? : A Little 6 Click Score: 19    End of Session Equipment Utilized During Treatment: Gait belt Activity Tolerance: Patient tolerated treatment well Patient left: in bed;with call bell/phone within reach;with bed alarm set;with SCD's reapplied Nurse Communication: Mobility status;Weight bearing status PT Visit Diagnosis: Other abnormalities of gait and mobility (R26.89);Muscle weakness (generalized) (M62.81);Pain;History of falling (Z91.81) Pain - Right/Left: Left Pain - part of body: Knee     Time: 9169-4503 PT Time Calculation (min) (ACUTE ONLY): 17 min  Charges:  $Gait Training: 8-22 mins $Therapeutic Exercise: 8-22 mins $Therapeutic Activity: 8-22 mins                     Desiree Hane SPT 02/02/21, 2:42 PM

## 2021-02-02 NOTE — Progress Notes (Signed)
  Subjective: 1 Day Post-Op Procedure(s) (LRB): COMPUTER ASSISTED TOTAL KNEE ARTHROPLASTY (Left) Patient reports pain as well-controlled.   Patient is well, and has had no acute complaints or problems Plan is to go Home after hospital stay. Negative for chest pain and shortness of breath Fever: no Gastrointestinal: negative for nausea and vomiting.  Patient has not had a bowel movement.  Objective: Vital signs in last 24 hours: Temp:  [97.5 F (36.4 C)-98.7 F (37.1 C)] 97.5 F (36.4 C) (07/28 0553) Pulse Rate:  [59-93] 60 (07/28 0553) Resp:  [16-23] 19 (07/28 0553) BP: (83-119)/(53-67) 113/53 (07/28 0553) SpO2:  [93 %-99 %] 98 % (07/28 0553)  Intake/Output from previous day:  Intake/Output Summary (Last 24 hours) at 02/02/2021 0827 Last data filed at 02/02/2021 0456 Gross per 24 hour  Intake 3087.83 ml  Output 1320 ml  Net 1767.83 ml    Intake/Output this shift: No intake/output data recorded.  Labs: No results for input(s): HGB in the last 72 hours. No results for input(s): WBC, RBC, HCT, PLT in the last 72 hours. No results for input(s): NA, K, CL, CO2, BUN, CREATININE, GLUCOSE, CALCIUM in the last 72 hours. No results for input(s): LABPT, INR in the last 72 hours.   EXAM General - Patient is Alert, Appropriate, and Oriented Extremity - Neurovascular intact Dorsiflexion/Plantar flexion intact Compartment soft Dressing/Incision -Polar Care in place and working. , Hemovac in place. , Following removal of post-op dressing, mild sanguinous drainage noted in the midpoint of the incision Motor Function - intact, moving foot and toes well on exam. Able to perform independent SLR.  Cardiovascular- Regular rate and rhythm, no murmurs/rubs/gallops Respiratory- Lungs clear to auscultation bilaterally Gastrointestinal- soft, nontender, and active bowel sounds   Assessment/Plan: 1 Day Post-Op Procedure(s) (LRB): COMPUTER ASSISTED TOTAL KNEE ARTHROPLASTY (Left) Active  Problems:   Total knee replacement status  Estimated body mass index is 29.84 kg/m as calculated from the following:   Height as of this encounter: 5\' 2"  (1.575 m).   Weight as of this encounter: 74 kg. Advance diet Up with therapy Discharge home with home health pending completion of PT goals    Post-op dressing removed. , Hemovac removed., and Mini compression dressing applied.    If needed, change honeycomb dressing prior to d/c  DVT Prophylaxis - Lovenox, Ted hose, and foot pumps Weight-Bearing as tolerated to left leg  , PA-C [facility] Orthopaedic Surgery 02/02/2021, 8:27 AM

## 2021-02-02 NOTE — Evaluation (Signed)
Occupational Therapy Evaluation Patient Details Name: Carlos [name]. MRN: [id number] DOB: Apr 03, 1931 Today's Date: 02/02/2021    History of Present Illness Pt is an 87 yo M diagnosed with degenerative arthrosis of the left knee and is s/p elective TKA.  PMH includes: HTN, SOB with exertion, AAA, DM, and R TKA.   Clinical Impression   Pt seen for OT evaluation this date, POD#1 from above surgery. Pt was independent in all ADLs prior to surgery and is eager to return to PLOF with less pain and improved safety and independence. Pt currently requires PRN MIN assist for LB ADL while in seated position due to slightly limited AROM of L knee. Performed bed mobility and ADL transfers with SBA + RW. Pt/family instructed in polar care mgt, falls prevention strategies, home/routines modifications, car transfers, pet care considerations, DME/AE for LB bathing and dressing tasks, and compression stocking mgt. Handout provided to support recall and carryover. Pt/family verbalized understanding. Pt would benefit from skilled OT services while hospitalized including additional instruction in dressing techniques with or without assistive devices for dressing and bathing skills to support recall and carryover prior to discharge and ultimately to maximize safety, independence, and minimize falls risk and caregiver burden. Do not currently anticipate any OT needs following this hospitalization.      Follow Up Recommendations  No OT follow up    Equipment Recommendations  None recommended by OT    Recommendations for Other Services       Precautions / Restrictions Precautions Precautions: Fall Restrictions Weight Bearing Restrictions: Yes LLE Weight Bearing: Weight bearing as tolerated Other Position/Activity Restrictions: Pt able to perform Ind L SLR, no KI required      Mobility Bed Mobility Overal bed mobility: Modified Independent             General bed mobility comments: Extra time and  effort only    Transfers Overall transfer level: Needs assistance Equipment used: Rolling walker (2 wheeled) Transfers: Sit to/from Stand Sit to Stand: Min guard;Supervision         General transfer comment: SBA, no difficulty noted    Balance Overall balance assessment: Needs assistance Sitting-balance support: Feet unsupported Sitting balance-Leahy Scale: Good     Standing balance support: Bilateral upper extremity supported;During functional activity Standing balance-Leahy Scale: Good                             ADL either performed or assessed with clinical judgement   ADL Overall ADL's : Modified independent                                       General ADL Comments: Generally modified independent with LB ADL tasks, PRN MIN A for LB ADL, polar care mgt, will require assist for compression stockings (family/pt educated in technique), SBA for ADL transfers + RW     Vision Patient Visual Report: No change from baseline       Perception     Praxis      Pertinent Vitals/Pain Pain Assessment: No/denies pain     Hand Dominance     Extremity/Trunk Assessment Upper Extremity Assessment Upper Extremity Assessment: Overall WFL for tasks assessed   Lower Extremity Assessment Lower Extremity Assessment: LLE deficits/detail LLE Deficits / Details: expected post-op strength/ROM Deficits       Communication Communication Communication: No difficulties  Cognition Arousal/Alertness: Awake/alert Behavior During Therapy: WFL for tasks assessed/performed Overall Cognitive Status: Within Functional Limits for tasks assessed                                     General Comments       Exercises Other Exercises Other Exercises: Pt/family educated in polar care mgt, falls prevention, compression stocking mgt, AE/DME, home/routines modifications, car transfers, and pet care considerations. Handout provided.   Shoulder  Instructions      Home Living Family/patient expects to be discharged to:: Private residence Living Arrangements: Children;Other relatives Available Help at Discharge: Family;Available 24 hours/day Type of Home: Mobile home Home Access: Stairs to enter Entrance Stairs-Number of Steps: 3 Entrance Stairs-Rails: Can reach both Home Layout: One level     Bathroom Shower/Tub: Chief Strategy Officer: Standard     Home Equipment: Environmental consultant - 2 wheels;Walker - 4 wheels;Adaptive equipment Adaptive Equipment: Reacher Additional Comments: has rusted old BSC that he borrowed, pt/family don't feel safe using      Prior Functioning/Environment Level of Independence: Independent        Comments: Ind amb community distances without an AD, Ind with ADLs, 3 falls in the last 6 months due to tripping        OT Problem List: Decreased strength;Decreased range of motion      OT Treatment/Interventions: Self-care/ADL training;Therapeutic exercise;Therapeutic activities;DME and/or AE instruction;Patient/family education;Balance training    OT Goals(Current goals can be found in the care plan section) Acute Rehab OT Goals Patient Stated Goal: To be able to mow the yard OT Goal Formulation: With patient/family Time For Goal Achievement: 02/16/21 Potential to Achieve Goals: Good ADL Goals Pt Will Perform Lower Body Dressing: with modified independence;sit to/from stand Pt Will Transfer to Toilet: with modified independence;ambulating (LRAD PRN) Additional ADL Goal #1: Pt will independently instruct family/caregiver in polar care mgt  OT Frequency: Min 1X/week   Barriers to D/C:            Co-evaluation              AM-PAC OT "6 Clicks" Daily Activity     Outcome Measure Help from another person eating meals?: None Help from another person taking care of personal grooming?: None Help from another person toileting, which includes using toliet, bedpan, or urinal?: A  Little Help from another person bathing (including washing, rinsing, drying)?: A Little Help from another person to put on and taking off regular upper body clothing?: None Help from another person to put on and taking off regular lower body clothing?: A Little 6 Click Score: 21   End of Session Equipment Utilized During Treatment: Gait belt;Rolling walker  Activity Tolerance: Patient tolerated treatment well Patient left: in chair;with call bell/phone within reach;with family/visitor present;Other (comment) (rolled towel under L ankle, polar care in place)  OT Visit Diagnosis: Other abnormalities of gait and mobility (R26.89)                Time: 1610-9604 OT Time Calculation (min): 23 min Charges:  OT General Charges $OT Visit: 1 Visit OT Evaluation $OT Eval Low Complexity: 1 Low OT Treatments $Self Care/Home Management : 8-22 mins  Wynona Canes, MPH, MS, OTR/L ascom [phone] 02/02/21, 12:12 PM

## 2021-02-02 NOTE — Anesthesia Postprocedure Evaluation (Signed)
Anesthesia Post Note  Patient: Deondrick Searls.  Procedure(s) Performed: COMPUTER ASSISTED TOTAL KNEE ARTHROPLASTY (Left: Knee)  Patient location during evaluation: Nursing Unit Anesthesia Type: Spinal Level of consciousness: awake, awake and alert, oriented and patient cooperative Pain management: pain level controlled Vital Signs Assessment: post-procedure vital signs reviewed and stable Respiratory status: spontaneous breathing and nonlabored ventilation Cardiovascular status: stable Postop Assessment: no headache, no backache, patient able to bend at knees, no apparent nausea or vomiting and adequate PO intake Anesthetic complications: no   No notable events documented.   Last Vitals:  Vitals:   02/01/21 2058 02/02/21 0553  BP: 102/60 (!) 113/53  Pulse: (!) 59 60  Resp: 19 19  Temp: 36.5 C (!) 36.4 C  SpO2: 97% 98%    Last Pain:  Vitals:   02/02/21 0553  TempSrc: Oral  PainSc:                  Lyn Records

## 2021-02-02 NOTE — Progress Notes (Signed)
Physical Therapy Treatment Patient Details Name: Carlos [name]. MRN: [id number] DOB: 06/05/1931 Today's Date: 02/02/2021    History of Present Illness Pt is an 87 yo M diagnosed with degenerative arthrosis of the left knee and is s/p elective TKA.  PMH includes: HTN, SOB with exertion, AAA, DM, and R TKA.    PT Comments    Pt was pleasant and motivated to participate during the session. Pt reported no pain but some bleeding noted at incision site. Pt is exceeding expected L knee AROM at 0-110 degrees. Pt gave great effort throughout session. Pt ambulated with increased speed progressing to step-through pattern and was overall steady using RW with no LOB. Pt navigated stairs with good eccentric/concentric control with min verbal cues for sequencing. Pt able to perform sit<>stand from non elevated surfaces with good eccentric/concentric control with no assist. Pt will benefit from HHPT upon discharge to safely address deficits listed in patient problem list for decreased caregiver assistance and eventual return to PLOF.     Follow Up Recommendations  Home health PT;Supervision for mobility/OOB     Equipment Recommendations  None recommended by PT    Recommendations for Other Services       Precautions / Restrictions Precautions Precautions: Fall Restrictions Weight Bearing Restrictions: Yes LLE Weight Bearing: Weight bearing as tolerated Other Position/Activity Restrictions: Pt able to perform Ind L SLR, no KI required    Mobility  Bed Mobility Overal bed mobility: Modified Independent             General bed mobility comments: NT, in recliner upon arrival    Transfers Overall transfer level: Needs assistance Equipment used: Rolling walker (2 wheeled) Transfers: Sit to/from Stand Sit to Stand: Supervision         General transfer comment: SBA, no difficulty noted; good eccentric/concentric control  Ambulation/Gait Ambulation/Gait assistance: Min guard Gait  Distance (Feet): 140 Feet x1, 81ft x1, 59ft x1 Assistive device: Rolling walker (2 wheeled) Gait Pattern/deviations: Step-through pattern Gait velocity: decreased   General Gait Details: Very minimal verbal cues for walker placement. Pt steady overall with no LOB.   Stairs Stairs: Yes Stairs assistance: Min guard Stair Management: Two rails;Forwards Number of Stairs: 4 General stair comments: Ascended/descended 4 steps. Verbal cues and demonstration for sequencing. Good eccentric/concentric control.   Wheelchair Mobility    Modified Rankin (Stroke Patients Only)       Balance Overall balance assessment: Needs assistance Sitting-balance support: Feet unsupported Sitting balance-Leahy Scale: Good     Standing balance support: Bilateral upper extremity supported Standing balance-Leahy Scale: Good Standing balance comment: Reliance on UE support through RW                            Cognition Arousal/Alertness: Awake/alert Behavior During Therapy: WFL for tasks assessed/performed Overall Cognitive Status: Within Functional Limits for tasks assessed                                        Exercises Total Joint Exercises Ankle Circles/Pumps: AROM;Strengthening;Both;10 reps;Seated (reclined in chair) Quad Sets: AROM;Left;10 reps;Seated (reclined in chair) Long Arc Quad: AROM;Strengthening;Left;10 reps;Seated Knee Flexion: AROM;Strengthening;Left;10 reps;Seated Goniometric ROM: L knee AROM: 0-110 deg Marching in Standing: AROM;Strengthening;Both;10 reps;Standing Other Exercises Other Exercises: Car transfer education provided verbally and via simulation and pt verbalized understanding. Other Exercises: Stair navigation training verbally and via demonstration and  pt verbalized understanding.    General Comments        Pertinent Vitals/Pain Pain Assessment: No/denies pain    Home Living Family/patient expects to be discharged to:: Private  residence Living Arrangements: Children;Other relatives Available Help at Discharge: Family;Available 24 hours/day Type of Home: Mobile home Home Access: Stairs to enter Entrance Stairs-Rails: Can reach both Home Layout: One level Home Equipment: Walker - 2 wheels;Walker - 4 wheels;Adaptive equipment Additional Comments: has rusted old BSC that he borrowed, pt/family don't feel safe using    Prior Function Level of Independence: Independent      Comments: Ind amb community distances without an AD, Ind with ADLs, 3 falls in the last 6 months due to tripping   PT Goals (current goals can now be found in the care plan section) Acute Rehab PT Goals Patient Stated Goal: To be able to mow the yard Progress towards PT goals: Progressing toward goals    Frequency    BID      PT Plan Current plan remains appropriate    Co-evaluation              AM-PAC PT "6 Clicks" Mobility   Outcome Measure  Help needed turning from your back to your side while in a flat bed without using bedrails?: None Help needed moving from lying on your back to sitting on the side of a flat bed without using bedrails?: A Little Help needed moving to and from a bed to a chair (including a wheelchair)?: A Little Help needed standing up from a chair using your arms (e.g., wheelchair or bedside chair)?: A Little Help needed to walk in hospital room?: A Little Help needed climbing 3-5 steps with a railing? : A Little 6 Click Score: 19    End of Session Equipment Utilized During Treatment: Gait belt Activity Tolerance: Patient tolerated treatment well Patient left: in chair;with call bell/phone within reach;with chair alarm set;with family/visitor present;with SCD's reapplied Nurse Communication: Mobility status;Weight bearing status PT Visit Diagnosis: Other abnormalities of gait and mobility (R26.89);Muscle weakness (generalized) (M62.81);Pain;History of falling (Z91.81) Pain - Right/Left: Left Pain -  part of body: Knee     Time: 1829-9371 PT Time Calculation (min) (ACUTE ONLY): 47 min  Charges:                        Desiree Hane SPT 02/02/21, 1:45 PM

## 2021-02-02 NOTE — Discharge Summary (Signed)
Physician Discharge Summary  Patient ID: Carlos [name]. MRN: [id number] DOB/AGE: [birth year] 87 y.o.  Admit date: 02/01/2021 Discharge date: 02/02/2021  Admission Diagnoses:  Total knee replacement status [Z96.659]  Surgeries:Procedure(s):  Left total knee arthroplasty using computer-assisted navigation   SURGEON:  Jena Gauss. M.D.   ASSISTANT: Baldwin Jamaica, PA-C (present and scrubbed throughout the case, critical for assistance with exposure, retraction, instrumentation, and closure)   ANESTHESIA: spinal   ESTIMATED BLOOD LOSS: 50 mL   FLUIDS REPLACED: 1000 mL of crystalloid   TOURNIQUET TIME: 84 minutes   DRAINS: 2 medium Hemovac drains   SOFT TISSUE RELEASES: Anterior cruciate ligament, posterior cruciate ligament, deep medial collateral ligament, patellofemoral ligament   IMPLANTS UTILIZED: DePuy Attune size 5 posterior stabilized femoral component (cemented), size 5 rotating platform tibial component (cemented), 38 mm medialized dome patella (cemented), and a 5 mm stabilized rotating platform polyethylene insert.  Discharge Diagnoses: Patient Active Problem List   Diagnosis Date Noted   Total knee replacement status 02/01/2021   Age-related physical debility 02/08/2020   Abdominal aortic aneurysm (AAA) without rupture (HCC) 01/14/2020   Pyocele 12/11/2019   Renal cyst 08/13/2019   Urethral calculus 06/25/2019   Myalgia 04/08/2019   Primary osteoarthritis of left knee 12/25/2018   Status post total knee replacement, right 04/04/2018   Controlled type 2 diabetes mellitus with stage 3 chronic kidney disease, without long-term current use of insulin (HCC) 11/04/2017   Bilateral carpal tunnel syndrome 10/28/2016   Essential hypertension 10/20/2015   Primary osteoarthritis of right hip 07/21/2014   CKD (chronic kidney disease) stage 3, GFR 30-59 ml/min (HCC) 07/21/2014   Pure hyperglyceridemia 01/18/2014   Elevated prostate specific antigen (PSA) 01/26/2013    Carcinoma in situ of prostate 01/26/2013   BPH with obstruction/lower urinary tract symptoms 01/26/2013    Past Medical History:  Diagnosis Date   AAA (abdominal aortic aneurysm) without rupture (HCC)    BPH (benign prostatic hyperplasia)    Chronic renal insufficiency    Diabetes mellitus without complication (HCC)    Dyspnea    Elevated lipids    History of kidney stones    Hypertension    Wears dentures    full upper and lower     Transfusion:   Consultants (if any):   Discharged Condition: Improved  Hospital Course: Cole Eastridge. is an 87 y.o. male who was admitted 02/01/2021 with a diagnosis of left knee osteoarthritis and went to the operating room on 02/01/2021 and underwent left total knee arthoplasty. The patient received perioperative antibiotics for prophylaxis (see below). The patient tolerated the procedure well and was transported to PACU in stable condition. After meeting PACU criteria, the patient was subsequently transferred to the Orthopaedics/Rehabilitation unit.   The patient received DVT prophylaxis in the form of early mobilization, Lovenox, Foot Pumps, and TED hose. A sacral pad had been placed and heels were elevated off of the bed with rolled towels in order to protect skin integrity. Foley catheter was discontinued on postoperative day #0. Wound drains were discontinued on postoperative day #1. The surgical incision was healing well without signs of infection.  Physical therapy was initiated postoperatively for transfers, gait training, and strengthening. Occupational therapy was initiated for activities of daily living and evaluation for assisted devices. Rehabilitation goals were reviewed in detail with the patient. The patient made steady progress with physical therapy and physical therapy recommended discharge to Home.   The patient achieved the preliminary goals of this hospitalization and  was felt to be medically and orthopaedically appropriate for  discharge.  He was given perioperative antibiotics:  Anti-infectives (From admission, onward)    Start     Dose/Rate Route Frequency Ordered Stop   02/01/21 1600  ceFAZolin (ANCEF) IVPB 2g/100 mL premix        2 g 200 mL/hr over 30 Minutes Intravenous Every 6 hours 02/01/21 1528 02/01/21 2225   02/01/21 1559  ceFAZolin (ANCEF) 2-4 GM/100ML-% IVPB       Note to Pharmacy: Telford Nab   : cabinet override      02/01/21 1559 02/02/21 0414   02/01/21 0617  ceFAZolin (ANCEF) 2-4 GM/100ML-% IVPB       Note to Pharmacy: Kerman Passey, Cryst: cabinet override      02/01/21 0617 02/01/21 1633   02/01/21 0615  ceFAZolin (ANCEF) IVPB 2g/100 mL premix        2 g 200 mL/hr over 30 Minutes Intravenous On call to O.R. 02/01/21 [id number] 02/01/21 0737     .  Recent vital signs:  Vitals:   02/02/21 0553 02/02/21 0827  BP: (!) 113/53 (!) 100/50  Pulse: 60 (!) 57  Resp: 19 16  Temp: (!) 97.5 F (36.4 C) 97.8 F (36.6 C)  SpO2: 98% 99%    Recent laboratory studies:  No results for input(s): WBC, HGB, HCT, PLT, K, CL, CO2, BUN, CREATININE, GLUCOSE, CALCIUM, LABPT, INR in the last 72 hours.  Diagnostic Studies: DG Knee Left Port  Result Date: 02/01/2021 CLINICAL DATA:  Knee replacement EXAM: PORTABLE LEFT KNEE - 1-2 VIEW COMPARISON:  None. FINDINGS: Postoperative changes of left total knee arthroplasty. Drain is present. There is postoperative soft tissue swelling and air. No evidence of complication. IMPRESSION: Standard postoperative appearance of left total knee arthroplasty. Electronically Signed   By: Guadlupe Spanish M.D.   On: 02/01/2021 11:51    Discharge Medications:   Allergies as of 02/02/2021   No Known Allergies      Medication List     STOP taking these medications    aspirin EC 81 MG tablet       TAKE these medications    celecoxib 200 MG capsule Commonly known as: CELEBREX Take 1 capsule (200 mg total) by mouth 2 (two) times daily.   dutasteride 0.5 MG  capsule Commonly known as: AVODART Take 0.5 mg by mouth daily.   enoxaparin 40 MG/0.4ML injection Commonly known as: LOVENOX Inject 0.4 mLs (40 mg total) into the skin daily for 14 days.   fenofibrate 145 MG tablet Commonly known as: TRICOR Take 145 mg by mouth daily.   glimepiride 4 MG tablet Commonly known as: AMARYL Take 2-4 mg by mouth See admin instructions. Take 4 mg with breakfast and 2 mg at lunch   lisinopril 20 MG tablet Commonly known as: ZESTRIL Take 10 mg by mouth daily.   multivitamin with minerals Tabs tablet Take 1 tablet by mouth daily.   rosuvastatin 10 MG tablet Commonly known as: CRESTOR Take 10 mg by mouth daily.   sitaGLIPtin 50 MG tablet Commonly known as: JANUVIA Take 50 mg by mouth daily.   tamsulosin 0.4 MG Caps capsule Commonly known as: FLOMAX Take 0.4 mg by mouth daily.   traMADol 50 MG tablet Commonly known as: ULTRAM Take 1 tablet (50 mg total) by mouth every 4 (four) hours as needed for moderate pain.               Durable Medical Equipment  (From admission, onward)  Start     Ordered   02/01/21 1529  DME Walker rolling  Once       Question:  Patient needs a walker to treat with the following condition  Answer:  Total knee replacement status   02/01/21 1528   02/01/21 1529  DME Bedside commode  Once       Question:  Patient needs a bedside commode to treat with the following condition  Answer:  Total knee replacement status   02/01/21 1528            Disposition: Home with home health PT     Follow-up Information     Madelyn Flavors, PA-C Follow up on 02/16/2021.   Specialty: Orthopedic Surgery Why: at 9:45am Contact information: [address] [facility] [place] [address] [phone]         Donato Heinz, MD Follow up on 03/16/2021.   Specialty: Orthopedic Surgery Why: at 2:30pm Contact information: [address] [facility] [address] [facility] [place] [address] [phone]                  Lasandra Beech, PA-C 02/02/2021, 10:45 AM

## 2021-02-02 NOTE — Progress Notes (Signed)
Met with the patient and his family at the bedside to discuss DC plan and needs He has an old BSC but will need a new one, he has a RW at home that is in good condition, [employer] will bring in the 3 in 1 to go home with, He is set up with [facility] for HH, He has transportation and also can afford his medications, no additional needs at this time 

## 2021-05-09 HISTORY — PX: TRANSTHORACIC ECHOCARDIOGRAM: SHX275

## 2021-05-27 ENCOUNTER — Observation Stay: Payer: Medicare PPO

## 2021-05-27 ENCOUNTER — Observation Stay
Admission: EM | Admit: 2021-05-27 | Discharge: 2021-05-28 | Disposition: A | Discharge status: Home / Self Care | Payer: Medicare PPO | Attending: Internal Medicine | Admitting: Internal Medicine

## 2021-05-27 ENCOUNTER — Emergency Department: Payer: Medicare PPO

## 2021-05-27 ENCOUNTER — Other Ambulatory Visit: Payer: Self-pay

## 2021-05-27 DIAGNOSIS — E781 Pure hyperglyceridemia: Secondary | ICD-10-CM | POA: Diagnosis present

## 2021-05-27 DIAGNOSIS — I10 Essential (primary) hypertension: Secondary | ICD-10-CM | POA: Diagnosis present

## 2021-05-27 DIAGNOSIS — Z96652 Presence of left artificial knee joint: Secondary | ICD-10-CM | POA: Diagnosis not present

## 2021-05-27 DIAGNOSIS — N138 Other obstructive and reflux uropathy: Secondary | ICD-10-CM | POA: Diagnosis present

## 2021-05-27 DIAGNOSIS — E1122 Type 2 diabetes mellitus with diabetic chronic kidney disease: Secondary | ICD-10-CM | POA: Diagnosis not present

## 2021-05-27 DIAGNOSIS — I63239 Cerebral infarction due to unspecified occlusion or stenosis of unspecified carotid arteries: Secondary | ICD-10-CM | POA: Diagnosis present

## 2021-05-27 DIAGNOSIS — N1831 Chronic kidney disease, stage 3a: Secondary | ICD-10-CM | POA: Insufficient documentation

## 2021-05-27 DIAGNOSIS — R54 Age-related physical debility: Secondary | ICD-10-CM | POA: Diagnosis present

## 2021-05-27 DIAGNOSIS — R41841 Cognitive communication deficit: Secondary | ICD-10-CM | POA: Insufficient documentation

## 2021-05-27 DIAGNOSIS — G459 Transient cerebral ischemic attack, unspecified: Secondary | ICD-10-CM | POA: Diagnosis not present

## 2021-05-27 DIAGNOSIS — I6389 Other cerebral infarction: Secondary | ICD-10-CM | POA: Diagnosis present

## 2021-05-27 DIAGNOSIS — Z79899 Other long term (current) drug therapy: Secondary | ICD-10-CM | POA: Insufficient documentation

## 2021-05-27 DIAGNOSIS — U071 COVID-19: Secondary | ICD-10-CM | POA: Diagnosis present

## 2021-05-27 DIAGNOSIS — I6521 Occlusion and stenosis of right carotid artery: Secondary | ICD-10-CM | POA: Diagnosis not present

## 2021-05-27 DIAGNOSIS — M1611 Unilateral primary osteoarthritis, right hip: Secondary | ICD-10-CM | POA: Diagnosis present

## 2021-05-27 DIAGNOSIS — I129 Hypertensive chronic kidney disease with stage 1 through stage 4 chronic kidney disease, or unspecified chronic kidney disease: Secondary | ICD-10-CM | POA: Diagnosis not present

## 2021-05-27 DIAGNOSIS — R29810 Facial weakness: Secondary | ICD-10-CM | POA: Diagnosis present

## 2021-05-27 DIAGNOSIS — Z794 Long term (current) use of insulin: Secondary | ICD-10-CM | POA: Insufficient documentation

## 2021-05-27 DIAGNOSIS — Z96659 Presence of unspecified artificial knee joint: Secondary | ICD-10-CM

## 2021-05-27 DIAGNOSIS — N183 Chronic kidney disease, stage 3 unspecified: Secondary | ICD-10-CM | POA: Diagnosis present

## 2021-05-27 DIAGNOSIS — I714 Abdominal aortic aneurysm, without rupture, unspecified: Secondary | ICD-10-CM | POA: Diagnosis present

## 2021-05-27 LAB — COMPREHENSIVE METABOLIC PANEL
ALT: 20 U/L (ref 0–44)
AST: 36 U/L (ref 15–41)
Albumin: 4.2 g/dL (ref 3.5–5.0)
Alkaline Phosphatase: 35 U/L — ABNORMAL LOW (ref 38–126)
Anion gap: 7 (ref 5–15)
BUN: 26 mg/dL — ABNORMAL HIGH (ref 8–23)
CO2: 25 mmol/L (ref 22–32)
Calcium: 9.2 mg/dL (ref 8.9–10.3)
Chloride: 107 mmol/L (ref 98–111)
Creatinine, Ser: 1.33 mg/dL — ABNORMAL HIGH (ref 0.61–1.24)
GFR, Estimated: 51 mL/min — ABNORMAL LOW (ref >60)
Glucose, Bld: 144 mg/dL — ABNORMAL HIGH (ref 70–99)
Potassium: 4.8 mmol/L (ref 3.5–5.1)
Sodium: 139 mmol/L (ref 135–145)
Total Bilirubin: 0.7 mg/dL (ref 0.3–1.2)
Total Protein: 7.2 g/dL (ref 6.5–8.1)

## 2021-05-27 LAB — CBC
HCT: 41.2 % (ref 39.0–52.0)
Hemoglobin: 13.5 g/dL (ref 13.0–17.0)
MCH: 28.8 pg (ref 26.0–34.0)
MCHC: 32.8 g/dL (ref 30.0–36.0)
MCV: 87.8 fL (ref 80.0–100.0)
Platelets: 245 10*3/uL (ref 150–400)
RBC: 4.69 MIL/uL (ref 4.22–5.81)
RDW: 13.1 % (ref 11.5–15.5)
WBC: 7.8 10*3/uL (ref 4.0–10.5)
nRBC: 0 % (ref 0.0–0.2)

## 2021-05-27 LAB — DIFFERENTIAL
Abs Immature Granulocytes: 0.1 10*3/uL — ABNORMAL HIGH (ref 0.00–0.07)
Basophils Absolute: 0.1 10*3/uL (ref 0.0–0.1)
Basophils Relative: 1 %
Eosinophils Absolute: 0.2 10*3/uL (ref 0.0–0.5)
Eosinophils Relative: 2 %
Immature Granulocytes: 1 %
Lymphocytes Relative: 14 %
Lymphs Abs: 1.1 10*3/uL (ref 0.7–4.0)
Monocytes Absolute: 0.6 10*3/uL (ref 0.1–1.0)
Monocytes Relative: 7 %
Neutro Abs: 5.8 10*3/uL (ref 1.7–7.7)
Neutrophils Relative %: 75 %

## 2021-05-27 LAB — GLUCOSE, CAPILLARY: Glucose-Capillary: 173 mg/dL — ABNORMAL HIGH (ref 70–99)

## 2021-05-27 LAB — PROTIME-INR
INR: 1 (ref 0.8–1.2)
Prothrombin Time: 13.3 seconds (ref 11.4–15.2)

## 2021-05-27 LAB — APTT: aPTT: 26 seconds (ref 24–36)

## 2021-05-27 LAB — MAGNESIUM: Magnesium: 2.1 mg/dL (ref 1.7–2.4)

## 2021-05-27 MED ORDER — SODIUM CHLORIDE 0.9% FLUSH
3.0000 mL | Freq: Once | INTRAVENOUS | Status: DC
Start: 2021-05-27 — End: 2021-05-27

## 2021-05-27 MED ORDER — ENOXAPARIN SODIUM 40 MG/0.4ML IJ SOSY
40.0000 mg | PREFILLED_SYRINGE | INTRAMUSCULAR | Status: DC
  Administered 2021-05-27: 23:00:00 40 mg via SUBCUTANEOUS
  Filled 2021-05-27: qty 0.4

## 2021-05-27 MED ORDER — GUAIFENESIN ER 600 MG PO TB12
600.0000 mg | ORAL_TABLET | Freq: Two times a day (BID) | ORAL | Status: DC
  Administered 2021-05-28 (×2): 600 mg via ORAL
  Filled 2021-05-27 (×2): qty 1

## 2021-05-27 MED ORDER — ROSUVASTATIN CALCIUM 10 MG PO TABS
10.0000 mg | ORAL_TABLET | Freq: Every day | ORAL | Status: DC
  Administered 2021-05-28: 10:00:00 10 mg via ORAL
  Filled 2021-05-27 (×3): qty 1

## 2021-05-27 MED ORDER — HYDRALAZINE HCL 20 MG/ML IJ SOLN: 10.0000 mg | Freq: Four times a day (QID) | INTRAMUSCULAR | Status: DC | PRN

## 2021-05-27 MED ORDER — BENZONATATE 100 MG PO CAPS
200.0000 mg | ORAL_CAPSULE | Freq: Two times a day (BID) | ORAL | Status: DC | PRN
  Administered 2021-05-27: 200 mg via ORAL
  Filled 2021-05-27: qty 2

## 2021-05-27 MED ORDER — INSULIN ASPART 100 UNIT/ML IJ SOLN
0.0000 [IU] | Freq: Three times a day (TID) | INTRAMUSCULAR | Status: DC
  Administered 2021-05-28: 3 [IU] via SUBCUTANEOUS
  Administered 2021-05-28: 2 [IU] via SUBCUTANEOUS

## 2021-05-27 MED ORDER — HYDRALAZINE HCL 20 MG/ML IJ SOLN
5.0000 mg | Freq: Four times a day (QID) | INTRAMUSCULAR | Status: DC | PRN
Start: 2021-05-27 — End: 2021-05-27

## 2021-05-27 MED ORDER — STROKE: EARLY STAGES OF RECOVERY BOOK: Freq: Once | Status: AC

## 2021-05-27 MED ORDER — ASPIRIN 81 MG PO CHEW
324.0000 mg | CHEWABLE_TABLET | Freq: Once | ORAL | Status: AC
  Administered 2021-05-28: 324 mg via ORAL
  Filled 2021-05-27 (×2): qty 4

## 2021-05-27 MED ORDER — FENOFIBRATE 160 MG PO TABS
160.0000 mg | ORAL_TABLET | Freq: Every day | ORAL | Status: DC
  Administered 2021-05-28: 10:00:00 160 mg via ORAL
  Filled 2021-05-27: qty 1

## 2021-05-27 MED ORDER — ACETAMINOPHEN 325 MG PO TABS: 650.0000 mg | ORAL_TABLET | ORAL | Status: DC | PRN

## 2021-05-27 MED ORDER — SENNOSIDES-DOCUSATE SODIUM 8.6-50 MG PO TABS: 1.0000 | ORAL_TABLET | Freq: Every evening | ORAL | Status: DC | PRN

## 2021-05-27 MED ORDER — ADULT MULTIVITAMIN W/MINERALS CH
1.0000 | ORAL_TABLET | Freq: Every day | ORAL | Status: DC
  Administered 2021-05-28: 1 via ORAL
  Filled 2021-05-27: qty 1

## 2021-05-27 MED ORDER — ACETAMINOPHEN 650 MG RE SUPP: 650.0000 mg | RECTAL | Status: DC | PRN

## 2021-05-27 MED ORDER — INSULIN ASPART 100 UNIT/ML IJ SOLN
0.0000 [IU] | Freq: Every day | INTRAMUSCULAR | Status: DC
Start: 2021-05-27 — End: 2021-05-28

## 2021-05-27 MED ORDER — ACETAMINOPHEN 160 MG/5ML PO SOLN
650.0000 mg | ORAL | Status: DC | PRN
  Filled 2021-05-27: qty 20.3

## 2021-05-27 NOTE — ED Notes (Addendum)
First Nurse Note:  Patient coming EMS for right-sided weakness and facial droop lasting 10 minutes and resolved before EMS arrival.

## 2021-05-27 NOTE — ED Triage Notes (Signed)
Pt to ED [facility] from home for ride sided facial droop while at home, aphasia.  Symptoms resolved on arrival to ED. Clear speech, alert and oriented. Equal grip and strength. No facial droop noted.  No code stroke per Dr Sidney Ace

## 2021-05-27 NOTE — ED Notes (Signed)
Admitting MD at bedside.

## 2021-05-27 NOTE — H&P (Addendum)
History and Physical   [employer]. WD:[id number] DOB: 07-24-1930 DOA: 05/27/2021  PCP: Adin Hector, MD  Outpatient Specialists: Dr. Marry Guan, orthopedic Patient coming from: Home via EMS  I have personally briefly reviewed patient's old medical records in [place].  Chief Concern: Right facial droop, numbness  HPI: Carlos [name]. is a 87 y.o. male with medical history significant for hyperlipidemia, non-insulin-dependent diabetes mellitus, CKD stage IIIa, hypertension, abdominal aortic aneurysm without rupture, BPH on Flomax, primary osteoarthritis of the left knee, status post knee replacement on the right, status post total left knee arthroplasty, who presents emergency department for chief concerns of acute onset right-sided facial droop, numbness and difficulty ambulating.  At approximately 12:30/1pm he was sitting down to eat supper when he developed tingling of bilateral nose area, for him this lasted approximately 30 seconds. Daughter noticed the right side of his mouth was sagging and not moving with his attempts to talk. She reports that he was talking and she was able to understand him, only that his voice seemed very low. She reports that his right side appeared to be dragging. She reports that he kept looking at his legs and had difficulty with controlling them.  Daughter reports that her observation of his drooping changes lasted approximately 5 to 10 minutes.  He reports he is never felt this way before.  He reports that for the last couple of weeks, he had had a cold with some cough and sputum. Though he endorsed that it is improved now.  He denied feeling short of breath.  Social history: He lives with his daughter. He denies history of tobacco use, and current etoh or recreational drug use. He formerly worked for the [employer].  Vaccination history: He is vaccinated for covid 19, two doses and one booster.    ROS: Constitutional: no weight change, no fever ENT/Mouth: no sore throat, no rhinorrhea Eyes: no eye pain, no vision changes Cardiovascular: no chest pain, no dyspnea,  no edema, no palpitations Respiratory: + cough, + sputum, no wheezing Gastrointestinal: no nausea, no vomiting, no diarrhea, no constipation Genitourinary: no urinary incontinence, no dysuria, no hematuria Musculoskeletal: no arthralgias, no myalgias Skin: no skin lesions, no pruritus, Neuro: + weakness, no loss of consciousness, no syncope Psych: no anxiety, no depression, no decrease appetite Heme/Lymph: no bruising, no bleeding  ED Course: Discussed with emergency medicine provider, patient requiring hospitalization for chief concerns of TIA versus stroke.  Vitals in the emergency department was remarkable for temperature of 98.2, respiration rate of 20, heart rate of 84, blood pressure 120/67, SPO2 of 93% on room air.  Labs in the emergency department was remarkable for sodium 139, potassium 4.8, chloride 107, bicarb 25, BUN of 26, serum creatinine of 1.33, nonfasting blood glucose 144, GFR 51, WBC 7.8, hemoglobin 13.5, platelets 245.  In the emergency department patient was given aspirin 324 mg once.  Assessment/Plan  Principal Problem:   Infarction of parietal lobe (HCC) Active Problems:   Pure hyperglyceridemia   Primary osteoarthritis of right hip   Essential hypertension   CKD (chronic kidney disease) stage 3, GFR 30-59 ml/min (HCC)   BPH with obstruction/lower urinary tract symptoms   Age-related physical debility   Abdominal aortic aneurysm (AAA) without rupture   Total knee replacement status   TIA (transient ischemic attack)   COVID-19 virus RNA detected   # Multifocal acute ischemia within the posterior right parietal lobe - CT head without contrast read  as: No acute intracranial findings.  Mild paranasal sinus disease. - A.m. team to consult neurology for further recommendations - Patient  is status post one-time dose of aspirin 324 mg per EDP - Portable chest x-ray ordered - Complete echo ordered - MRI of the brain ordered - Check fasting lipid and A1c ordered - Check magnesium, phosphorus, B12, vitamin D level - Permissive hypertension, hydralazine injection 10 mg IV every 6 hours as needed for SBP greater than 180, 1 day ordered - Frequent neuro vascular checks - N.p.o. pending swallow study - PT, OT - Fall precaution  - Admit to telemetry medical floor, observation  # Primary hypertension-Home lisinopril 10 mg daily has not been resumed - at this time, we will continue with permissive hypertension for 1 day as above  # Hyperlipidemia-rosuvastatin 10 mg nightly resumed, fenofibrate 145 mg daily has been resumed  # Non-insulin-dependent diabetes mellitus-holding home glimepiride 4 mg with breakfast and 2 mg at lunch, Sitagliptin 50 mg daily have not been resumed at this time - Goal blood glucose level while inpatient is 140-180  # BPH-holding home tamsulosin and dutasteride  # COVID-19 positive test on PCR-this is incidental finding - Patient likely has had COVID-19 for the last 2 weeks given that his symptoms started about 2 weeks ago - He is not using accessory muscles to respirate and is not hypoxic, therefore remdesivir and steroids have been deferred at this time - Contact and droplet precaution  Chart reviewed.   DVT prophylaxis: Enoxaparin 40 mg subcutaneous nightly Code Status: Full code Diet: Heart healthy/carb modified Family Communication: Updated daughter Sherrel Wallman at bedside Disposition Plan: Pending MRI, complete echo, anticipate less than 2 midnight stay Consults called: None at this time, a.m. team to consult neurology  Admission status: Telemetry medical, observation  Past Medical History:  Diagnosis Date   AAA (abdominal aortic aneurysm) without rupture    BPH (benign prostatic hyperplasia)    Chronic renal insufficiency     Diabetes mellitus without complication (HCC)    Dyspnea    Elevated lipids    History of kidney stones    Hypertension    Wears dentures    full upper and lower   Past Surgical History:  Procedure Laterality Date   CARPAL TUNNEL RELEASE Right 11/12/2016   Procedure: CARPAL TUNNEL RELEASE;  Surgeon: Leanor Kail, MD;  Location: [facility];  Service: Orthopedics;  Laterality: Right;   CARPAL TUNNEL RELEASE Left 08/09/2017   Procedure: OPEN CARPAL TUNNEL RELEASE;  Surgeon: Leanor Kail, MD;  Location: [place];  Service: Orthopedics;  Laterality: Left;  Diabetic - oral meds   CATARACT EXTRACTION W/ INTRAOCULAR LENS  IMPLANT, BILATERAL     KNEE ARTHROPLASTY Left 02/01/2021   Procedure: COMPUTER ASSISTED TOTAL KNEE ARTHROPLASTY;  Surgeon: Dereck Leep, MD;  Location: [facility];  Service: Orthopedics;  Laterality: Left;   LITHOTRIPSY  09/2018   at [facility]  Right 12/2019   TONSILLECTOMY  1960   Social History:  reports that he has never smoked. He has never used smokeless tobacco. He reports that he does not currently use alcohol. He reports that he does not use drugs.  No Known Allergies Family History  Problem Relation Age of Onset   Coronary artery disease Father    Colon cancer Father    Colon cancer Brother    Family history: Family history reviewed and not pertinent  Prior to Admission medications   Medication Sig Start Date End Date Taking? Authorizing  Provider  celecoxib (CELEBREX) 200 MG capsule Take 1 capsule (200 mg total) by mouth 2 (two) times daily. 02/02/21   Tamala Julian B, PA-C  dutasteride (AVODART) 0.5 MG capsule Take 0.5 mg by mouth daily.    [provider]  enoxaparin (LOVENOX) 40 MG/0.4ML injection Inject 0.4 mLs (40 mg total) into the skin daily for 14 days. 02/02/21 02/16/21  Fausto Skillern, PA-C  fenofibrate (TRICOR) 145 MG tablet Take 145 mg by mouth daily.    [provider]  glimepiride (AMARYL) 4 MG tablet  Take 2-4 mg by mouth See admin instructions. Take 4 mg with breakfast and 2 mg at lunch    [provider]  lisinopril (PRINIVIL,ZESTRIL) 20 MG tablet Take 10 mg by mouth daily.    [provider]  Multiple Vitamin (MULTIVITAMIN WITH MINERALS) TABS tablet Take 1 tablet by mouth daily.    [provider]  rosuvastatin (CRESTOR) 10 MG tablet Take 10 mg by mouth daily.    [provider]  sitaGLIPtin (JANUVIA) 50 MG tablet Take 50 mg by mouth daily.    [provider]  tamsulosin (FLOMAX) 0.4 MG CAPS capsule Take 0.4 mg by mouth daily.    [provider]  traMADol (ULTRAM) 50 MG tablet Take 1 tablet (50 mg total) by mouth every 4 (four) hours as needed for moderate pain. 02/02/21   Fausto Skillern, PA-C   Physical Exam: Vitals:   05/27/21 1800 05/27/21 1830 05/27/21 1957 05/27/21 2236  BP: 97/61 120/64 133/74 120/62  Pulse: 86 79 79 84  Resp:   (!) 21 18  Temp:   97.6 F (36.4 C) 98 F (36.7 C)  TempSrc:   Axillary Oral  SpO2: 98% 97% 96% 97%  Weight:    73.6 kg  Height:    5\' 2"  (1.575 m)   Constitutional: appears age-appropriate, frail, NAD, calm, comfortable Eyes: PERRL, lids and conjunctivae normal ENMT: Mucous membranes are moist. Posterior pharynx clear of any exudate or lesions. Age-appropriate dentition.  Mild bilateral hearing loss requiring hearing aid Neck: normal, supple, no masses, no thyromegaly Respiratory: clear to auscultation bilaterally, no wheezing, no crackles. Normal respiratory effort. No accessory muscle use.  Cardiovascular: Regular rate and rhythm, no murmurs / rubs / gallops. No extremity edema. 2+ pedal pulses. No carotid bruits.  Abdomen: no tenderness, no masses palpated, no hepatosplenomegaly. Bowel sounds positive.  Musculoskeletal: no clubbing / cyanosis. No joint deformity upper and lower extremities. Good ROM, no contractures, no atrophy. Normal muscle tone.  Skin: no rashes, lesions, ulcers. No  induration Neurologic: Sensation intact. Strength 5/5 in left-sided upper and lower extremity.  Strength is a 4.5 out of 5 left upper and lower extremity Psychiatric: Normal judgment and insight. Alert and oriented x 3. Normal mood.   EKG: independently reviewed, showing sinus rhythm with rate of 96, first-degree AV block, QTc 444  Chest x-ray on Admission: I personally reviewed and I agree with radiologist reading as below.  CT HEAD WO CONTRAST  Result Date: 05/27/2021 CLINICAL DATA:  TIA, right facial droop EXAM: CT HEAD WITHOUT CONTRAST TECHNIQUE: Contiguous axial images were obtained from the base of the skull through the vertex without intravenous contrast. COMPARISON:  None. FINDINGS: Brain: No evidence of acute infarction, hemorrhage, hydrocephalus, extra-axial collection or mass lesion/mass effect. Vascular: Atherosclerotic calcifications involving the large vessels of the skull base. No unexpected hyperdense vessel. Skull: Normal. Negative for fracture or focal lesion. Sinuses/Orbits: Mucosal thickening within the bilateral maxillary sinuses and ethmoid  air cells. Other: None. IMPRESSION: 1. No acute intracranial findings. 2. Mild paranasal sinus disease. Electronically Signed   By: Duanne Guess D.O.   On: 05/27/2021 16:23   MR BRAIN WO CONTRAST  Result Date: 05/27/2021 CLINICAL DATA:  Transient ischemic attack EXAM: MRI HEAD WITHOUT CONTRAST TECHNIQUE: Multiplanar, multiecho pulse sequences of the brain and surrounding structures were obtained without intravenous contrast. COMPARISON:  None. FINDINGS: Brain: Multifocal acute ischemia within the posterior right parietal lobe. No acute or chronic hemorrhage. There is multifocal hyperintense T2-weighted signal within the white matter. Generalized volume loss without a clear lobar predilection. The midline structures are normal. Vascular: Major flow voids are preserved. Skull and upper cervical spine: Normal calvarium and skull base.  Visualized upper cervical spine and soft tissues are normal. Sinuses/Orbits:No paranasal sinus fluid levels or advanced mucosal thickening. No mastoid or middle ear effusion. Normal orbits. IMPRESSION: Multifocal acute ischemia within the posterior right parietal lobe. No hemorrhage or mass effect. Electronically Signed   By: Deatra Robinson M.D.   On: 05/27/2021 22:22   DG Chest Port 1 View  Result Date: 05/27/2021 CLINICAL DATA:  Right facial droop, aphasia EXAM: PORTABLE CHEST 1 VIEW COMPARISON:  None. FINDINGS: Two frontal views of the chest demonstrate an unremarkable cardiac silhouette. No acute airspace disease, effusion, or pneumothorax. No acute bony abnormalities. IMPRESSION: 1. No acute intrathoracic process. Electronically Signed   By: Sharlet Salina M.D.   On: 05/27/2021 19:34    Labs on Admission: I have personally reviewed following labs  CBC: Recent Labs  Lab 05/27/21 1455  WBC 7.8  NEUTROABS 5.8  HGB 13.5  HCT 41.2  MCV 87.8  PLT 245   Basic Metabolic Panel: Recent Labs  Lab 05/27/21 1455  NA 139  K 4.8  CL 107  CO2 25  GLUCOSE 144*  BUN 26*  CREATININE 1.33*  CALCIUM 9.2  MG 2.1   GFR: Estimated Creatinine Clearance: 33.8 mL/min (A) (by C-G formula based on SCr of 1.33 mg/dL (H)).  Liver Function Tests: Recent Labs  Lab 05/27/21 1455  AST 36  ALT 20  ALKPHOS 35*  BILITOT 0.7  PROT 7.2  ALBUMIN 4.2   Coagulation Profile: Recent Labs  Lab 05/27/21 1455  INR 1.0   Urine analysis:    Component Value Date/Time   COLORURINE YELLOW (A) 01/23/2021 1048   APPEARANCEUR HAZY (A) 01/23/2021 1048   LABSPEC 1.020 01/23/2021 1048   PHURINE 5.0 01/23/2021 1048   GLUCOSEU NEGATIVE 01/23/2021 1048   HGBUR NEGATIVE 01/23/2021 1048   BILIRUBINUR NEGATIVE 01/23/2021 1048   KETONESUR NEGATIVE 01/23/2021 1048   PROTEINUR NEGATIVE 01/23/2021 1048   NITRITE NEGATIVE 01/23/2021 1048   LEUKOCYTESUR TRACE (A) 01/23/2021 1048   Dr. Sedalia Muta [facility]  If  7PM-7AM, please contact overnight-coverage provider If 7AM-7PM, please contact day coverage provider [email or web address]  05/28/2021, 12:34 AM

## 2021-05-27 NOTE — ED Provider Notes (Signed)
[facility]  ____________________________________________   Event Date/Time   First MD Initiated Contact with Patient 05/27/21 1752     (approximate)  I have reviewed the triage vital signs and the nursing notes.   HISTORY  Chief Complaint Facial Droop    HPI Carlos [name]. is a 87 y.o. male with past medical history of diabetes, chronic renal insufficiency, hypertension who presents after an episode of right-sided facial droop and weakness that is now resolved.  Patient is accompanied by his daughter.  He was sitting at the table around 1 PM today when he noticed that his right side of his face felt numb and the daughter noticed a right-sided facial droop.  At this time he was having some difficulty formulating his words and his daughter felt like he was confused.  At the same time he was having difficulty getting catch-up container opened and noticed that one of his legs was not able to turn outward like it showed.  The patient tells me that it was the left leg however his daughter notes that she saw him struggling with the right leg.  He is also not able to ambulate during this time.  Resolved in a period of about 10 minutes.  Currently he feels back to baseline denies any numbness or weakness or visual change.  He denies any chest pain or shortness of breath.  He has no history of TIA or stroke.         Past Medical History:  Diagnosis Date   AAA (abdominal aortic aneurysm) without rupture    BPH (benign prostatic hyperplasia)    Chronic renal insufficiency    Diabetes mellitus without complication (HCC)    Dyspnea    Elevated lipids    History of kidney stones    Hypertension    Wears dentures    full upper and lower    Patient Active Problem List   Diagnosis Date Noted   Total knee replacement status 02/01/2021   Age-related physical debility 02/08/2020   Abdominal aortic aneurysm (AAA) without rupture 01/14/2020   Pyocele 12/11/2019    Renal cyst 08/13/2019   Urethral calculus 06/25/2019   Myalgia 04/08/2019   Primary osteoarthritis of left knee 12/25/2018   Status post total knee replacement, right 04/04/2018   Controlled type 2 diabetes mellitus with stage 3 chronic kidney disease, without long-term current use of insulin ([place] 11/04/2017   Bilateral carpal tunnel syndrome 10/28/2016   Essential hypertension 10/20/2015   Primary osteoarthritis of right hip 07/21/2014   CKD (chronic kidney disease) stage 3, GFR 30-59 ml/min (HCC) 07/21/2014   Pure hyperglyceridemia 01/18/2014   Elevated prostate specific antigen (PSA) 01/26/2013   Carcinoma in situ of prostate 01/26/2013   BPH with obstruction/lower urinary tract symptoms 01/26/2013    Past Surgical History:  Procedure Laterality Date   CARPAL TUNNEL RELEASE Right 11/12/2016   Procedure: CARPAL TUNNEL RELEASE;  Surgeon: Leanor Kail, MD;  Location: [facility];  Service: Orthopedics;  Laterality: Right;   CARPAL TUNNEL RELEASE Left 08/09/2017   Procedure: OPEN CARPAL TUNNEL RELEASE;  Surgeon: Leanor Kail, MD;  Location: [place];  Service: Orthopedics;  Laterality: Left;  Diabetic - oral meds   CATARACT EXTRACTION W/ INTRAOCULAR LENS  IMPLANT, BILATERAL     KNEE ARTHROPLASTY Left 02/01/2021   Procedure: COMPUTER ASSISTED TOTAL KNEE ARTHROPLASTY;  Surgeon: Dereck Leep, MD;  Location: [facility];  Service: Orthopedics;  Laterality: Left;   LITHOTRIPSY  09/2018  at [facility]   testical  Right 12/2019   TONSILLECTOMY  1960    Prior to Admission medications   Medication Sig Start Date End Date Taking? Authorizing Provider  celecoxib (CELEBREX) 200 MG capsule Take 1 capsule (200 mg total) by mouth 2 (two) times daily. 02/02/21   Lasandra Beech B, PA-C  dutasteride (AVODART) 0.5 MG capsule Take 0.5 mg by mouth daily.    [provider]  enoxaparin (LOVENOX) 40 MG/0.4ML injection Inject 0.4 mLs (40 mg total) into the skin daily for 14 days. 02/02/21  02/16/21  Madelyn Flavors, PA-C  fenofibrate (TRICOR) 145 MG tablet Take 145 mg by mouth daily.    [provider]  glimepiride (AMARYL) 4 MG tablet Take 2-4 mg by mouth See admin instructions. Take 4 mg with breakfast and 2 mg at lunch    [provider]  lisinopril (PRINIVIL,ZESTRIL) 20 MG tablet Take 10 mg by mouth daily.    [provider]  Multiple Vitamin (MULTIVITAMIN WITH MINERALS) TABS tablet Take 1 tablet by mouth daily.    [provider]  rosuvastatin (CRESTOR) 10 MG tablet Take 10 mg by mouth daily.    [provider]  sitaGLIPtin (JANUVIA) 50 MG tablet Take 50 mg by mouth daily.    [provider]  tamsulosin (FLOMAX) 0.4 MG CAPS capsule Take 0.4 mg by mouth daily.    [provider]  traMADol (ULTRAM) 50 MG tablet Take 1 tablet (50 mg total) by mouth every 4 (four) hours as needed for moderate pain. 02/02/21   Madelyn Flavors, PA-C    Allergies Patient has no known allergies.  No family history on file.  Social History Social History   Tobacco Use   Smoking status: Never   Smokeless tobacco: Never  Vaping Use   Vaping Use: Never used  Substance Use Topics   Alcohol use: No    Comment: may have a beer a few times each year   Drug use: No    Review of Systems   Review of Systems  Constitutional:  Negative for chills and fever.  Respiratory:  Negative for shortness of breath.   Cardiovascular:  Negative for chest pain.  Neurological:  Positive for facial asymmetry, speech difficulty, weakness and numbness. Negative for headaches.  All other systems reviewed and are negative.  Physical Exam Updated Vital Signs BP 120/67   Pulse 84   Temp 98.2 F (36.8 C) (Oral)   Resp 20   Ht 5\' 2"  (1.575 m)   Wt 73.5 kg   SpO2 93%   BMI 29.63 kg/m   Physical Exam Vitals and nursing note reviewed.  Constitutional:      General: He is not in acute distress.    Appearance: Normal appearance.  HENT:      Head: Normocephalic and atraumatic.  Eyes:     General: No scleral icterus.    Conjunctiva/sclera: Conjunctivae normal.  Pulmonary:     Effort: Pulmonary effort is normal. No respiratory distress.     Breath sounds: Normal breath sounds. No wheezing.  Musculoskeletal:        General: No deformity or signs of injury.     Cervical back: Normal range of motion.  Skin:    Coloration: Skin is not jaundiced or pale.  Neurological:     General: No focal deficit present.     Mental Status: He is alert and oriented to person, place, and time. Mental status is at baseline.  Comments: Aox3, nml speech  PERRL, EOMI, face symmetric, nml tongue movement  5/5 LUE and LLE 5/5 RUE with elbow flexion/extension, slight drift of RUE compared to left Sensation grossly intact in the BL upper and lower extremities  Finger-nose-finger intact BL   Psychiatric:        Mood and Affect: Mood normal.        Behavior: Behavior normal.     LABS (all labs ordered are listed, but only abnormal results are displayed)  Labs Reviewed  DIFFERENTIAL - Abnormal; Notable for the following components:      Result Value   Abs Immature Granulocytes 0.10 (*)    All other components within normal limits  COMPREHENSIVE METABOLIC PANEL - Abnormal; Notable for the following components:   Glucose, Bld 144 (*)    BUN 26 (*)    Creatinine, Ser 1.33 (*)    Alkaline Phosphatase 35 (*)    GFR, Estimated 51 (*)    All other components within normal limits  PROTIME-INR  APTT  CBC  CBG MONITORING, ED   ____________________________________________  EKG  NSR, nml axis, nml intervals, no acute ischemic changes  ____________________________________________  RADIOLOGY I, Madelin Headings, personally viewed and evaluated these images (plain radiographs) as part of my medical decision making, as well as reviewing the written report by the radiologist.  ED MD interpretation:  I reviewed the CT scan of the brain which  does not show any acute intracranial process      ____________________________________________   PROCEDURES  Procedure(s) performed (including Critical Care):  Procedures   ____________________________________________   INITIAL IMPRESSION / ASSESSMENT AND PLAN / ED COURSE   87 year old male presenting after an episode of right-sided facial droop and weakness that is now resolved.  The event included right-sided facial droop inability to ambulate difficulty with speech and lateral lower extremity weakness.  Somewhat unclear which side this was as the patient tells me it was the left leg but his daughter who seems to be more of a reliable historian tells me that she saw him having difficulty with the right leg.  This resolved after 10 minutes.  Vital signs are within normal limits.  He is well-appearing.  Neurologic exam is only notable for very mild right-sided pronator drift in the upper extremity but otherwise he is neuro intact.  CT head does not show anything acute.  I am concerned for TIA, he has an ABCD 2 risk score of 4 given his age and comorbidities and the concerning symptoms.  Will admit for TIA work-up.  We will give an aspirin.      ____________________________________________   FINAL CLINICAL IMPRESSION(S) / ED DIAGNOSES  Final diagnoses:  TIA (transient ischemic attack)     ED Discharge Orders     None        Note:  This document was prepared using Dragon voice recognition software and may include unintentional dictation errors.    Rada Hay, MD 05/27/21 [phone]

## 2021-05-28 ENCOUNTER — Observation Stay: Payer: Medicare PPO

## 2021-05-28 ENCOUNTER — Observation Stay (HOSPITAL_BASED_OUTPATIENT_CLINIC_OR_DEPARTMENT_OTHER)
Admit: 2021-05-28 | Discharge: 2021-05-28 | Disposition: A | Discharge status: Home / Self Care | Payer: Medicare PPO | Attending: Internal Medicine | Admitting: Internal Medicine

## 2021-05-28 ENCOUNTER — Encounter: Payer: Self-pay | Admitting: Internal Medicine

## 2021-05-28 ENCOUNTER — Observation Stay
Admit: 2021-05-28 | Discharge: 2021-05-28 | Disposition: A | Discharge status: Home / Self Care | Payer: Medicare PPO | Attending: Internal Medicine | Admitting: Internal Medicine

## 2021-05-28 DIAGNOSIS — I6389 Other cerebral infarction: Secondary | ICD-10-CM | POA: Diagnosis not present

## 2021-05-28 DIAGNOSIS — I6521 Occlusion and stenosis of right carotid artery: Secondary | ICD-10-CM | POA: Diagnosis present

## 2021-05-28 DIAGNOSIS — U071 COVID-19: Secondary | ICD-10-CM | POA: Diagnosis not present

## 2021-05-28 DIAGNOSIS — G459 Transient cerebral ischemic attack, unspecified: Secondary | ICD-10-CM | POA: Diagnosis not present

## 2021-05-28 DIAGNOSIS — I63239 Cerebral infarction due to unspecified occlusion or stenosis of unspecified carotid arteries: Secondary | ICD-10-CM | POA: Diagnosis present

## 2021-05-28 LAB — RESP PANEL BY RT-PCR (FLU A&B, COVID) ARPGX2
Influenza A by PCR: NEGATIVE
Influenza B by PCR: NEGATIVE
SARS Coronavirus 2 by RT PCR: POSITIVE — AB

## 2021-05-28 LAB — CBC
HCT: 37.8 % — ABNORMAL LOW (ref 39.0–52.0)
Hemoglobin: 12.4 g/dL — ABNORMAL LOW (ref 13.0–17.0)
MCH: 29.2 pg (ref 26.0–34.0)
MCHC: 32.8 g/dL (ref 30.0–36.0)
MCV: 88.9 fL (ref 80.0–100.0)
Platelets: 261 10*3/uL (ref 150–400)
RBC: 4.25 MIL/uL (ref 4.22–5.81)
RDW: 12.9 % (ref 11.5–15.5)
WBC: 5.5 10*3/uL (ref 4.0–10.5)
nRBC: 0 % (ref 0.0–0.2)

## 2021-05-28 LAB — BASIC METABOLIC PANEL
Anion gap: 7 (ref 5–15)
BUN: 24 mg/dL — ABNORMAL HIGH (ref 8–23)
CO2: 26 mmol/L (ref 22–32)
Calcium: 8.8 mg/dL — ABNORMAL LOW (ref 8.9–10.3)
Chloride: 106 mmol/L (ref 98–111)
Creatinine, Ser: 1.19 mg/dL (ref 0.61–1.24)
GFR, Estimated: 59 mL/min — ABNORMAL LOW (ref >60)
Glucose, Bld: 141 mg/dL — ABNORMAL HIGH (ref 70–99)
Potassium: 3.7 mmol/L (ref 3.5–5.1)
Sodium: 139 mmol/L (ref 135–145)

## 2021-05-28 LAB — VITAMIN B12: Vitamin B-12: 306 pg/mL (ref 180–914)

## 2021-05-28 LAB — LIPID PANEL
Cholesterol: 107 mg/dL (ref 0–200)
HDL: 19 mg/dL — ABNORMAL LOW (ref >40)
LDL Cholesterol: 49 mg/dL (ref 0–99)
Total CHOL/HDL Ratio: 5.6 RATIO
Triglycerides: 196 mg/dL — ABNORMAL HIGH (ref <150)
VLDL: 39 mg/dL (ref 0–40)

## 2021-05-28 LAB — VITAMIN D 25 HYDROXY (VIT D DEFICIENCY, FRACTURES): Vit D, 25-Hydroxy: 36.59 ng/mL (ref 30–100)

## 2021-05-28 LAB — GLUCOSE, CAPILLARY
Glucose-Capillary: 131 mg/dL — ABNORMAL HIGH (ref 70–99)
Glucose-Capillary: 173 mg/dL — ABNORMAL HIGH (ref 70–99)
Glucose-Capillary: 116 mg/dL — ABNORMAL HIGH (ref 70–99)

## 2021-05-28 LAB — HEMOGLOBIN A1C
Hgb A1c MFr Bld: 6.9 % — ABNORMAL HIGH (ref 4.8–5.6)
Mean Plasma Glucose: 151.33 mg/dL

## 2021-05-28 IMAGING — CT CT ANGIO HEAD-NECK (W OR W/O PERF)
2 of 11 series · 7 of 33 positions shown · IV contrast (omnipaque)
Comparison: Brain MRI and CT head dated 1 day prior

CLINICAL DATA: Stroke follow-up

EXAM:
CT ANGIOGRAPHY HEAD AND NECK
TECHNIQUE: Multidetector CT imaging of the head and neck was performed using
the standard protocol during bolus administration of intravenous
contrast. Multiplanar CT image reconstructions and MIPs were
obtained to evaluate the vascular anatomy. Carotid stenosis
measurements (when applicable) are obtained utilizing NASCET
criteria, using the distal internal carotid diameter as the
denominator.
CONTRAST:  75mL OMNIPAQUE IOHEXOL 350 MG/ML SOLN

[Series 8: cta head neck · axial · 0.53mm/px · z∈[-173,-59]mm · 2 of 171 slices shown]
[im 57/171  soft-tissue]
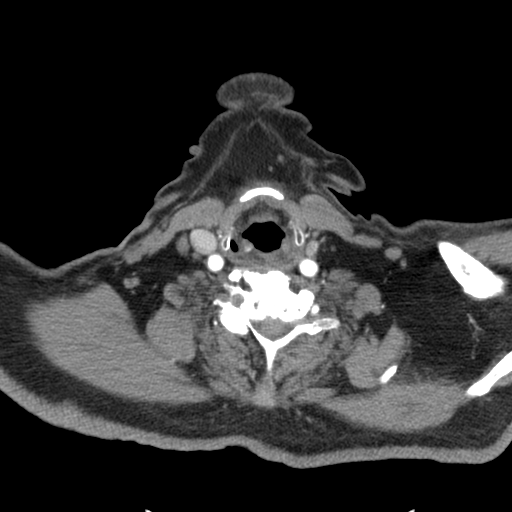
[im 114/171  soft-tissue]
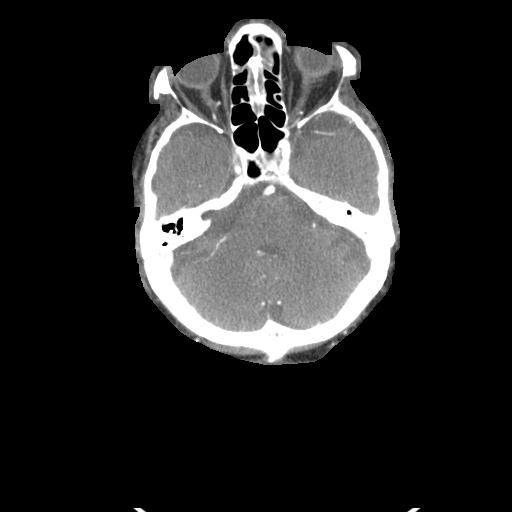

[Series 10: ax thin · axial · 0.50mm/px · z∈[-234,-10]mm · 5 of 336 slices shown]
[im 56/336  soft-tissue]
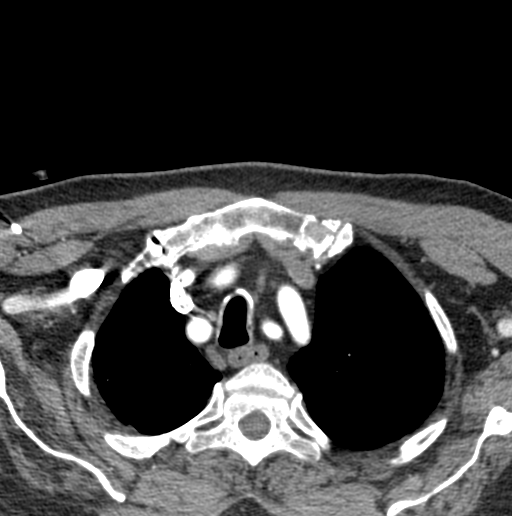
[im 112/336  bone]
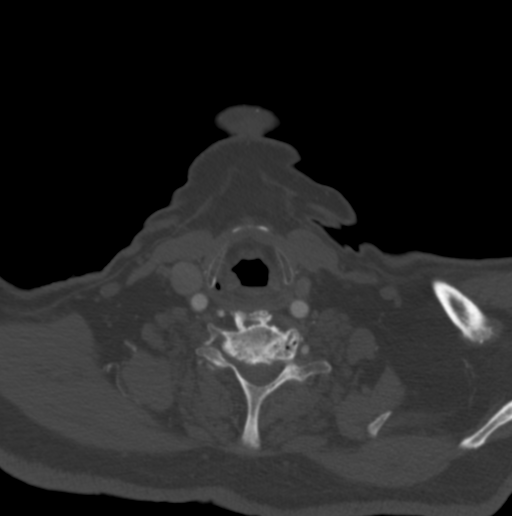
[im 168/336  soft-tissue]
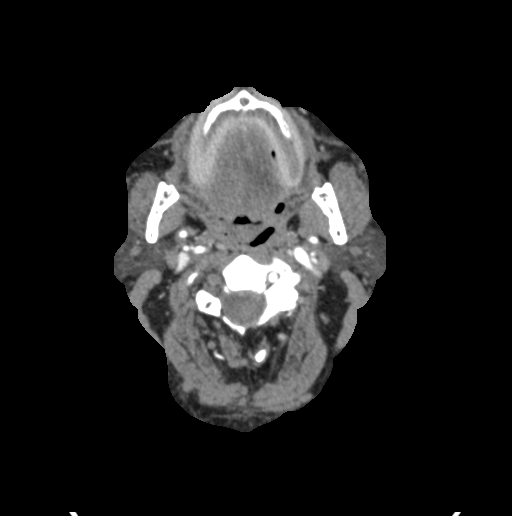
[im 224/336  bone]
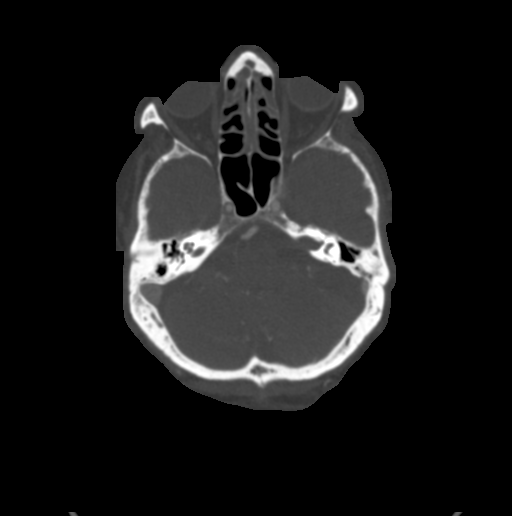
[im 280/336  soft-tissue]
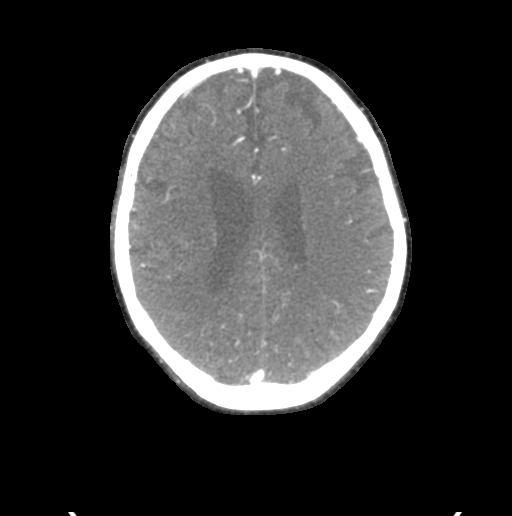

[7 of 33 positions shown; findings below may reference images not displayed]

FINDINGS: CT HEAD FINDINGS

Brain: There is a small focus of hypodensity in the right parietal
lobe consistent with evolving ischemia as seen on the prior brain
MRI. The additional small infarcts seen on that study are not well
appreciated. There is no new large vessel territorial infarct. There
is no acute intracranial hemorrhage or extra-axial fluid collection

Parenchymal volume is stable. The ventricles are normal in size.
There is no mass lesion. There is no midline shift.

Vascular: See below

Skull: Normal. Negative for fracture or focal lesion.

Sinuses: There is mild mucosal thickening in the paranasal sinuses.

Orbits: Bilateral lens implants are in place. The globes and orbits
are otherwise unremarkable.

Review of the MIP images confirms the above findings

CTA NECK FINDINGS

Aortic arch: There is mild calcified atherosclerotic plaque in the
aortic arch without significant stenosis. No evidence of dissection.
The origins of the great vessels are patent.

Right carotid system: There is primarily soft plaque at the proximal
right internal carotid artery resulting in up to approximately
70-80% stenosis. The distal right internal carotid artery is patent.
The right external carotid artery is patent.

Left carotid system: There is minimal calcified atherosclerotic
plaque in the proximal left internal carotid artery without
hemodynamically significant stenosis. There is no evidence of
dissection or aneurysm.

Vertebral arteries: The right vertebral artery is occluded from its
origin through the V2/V3 junction where there is reconstitution of
flow.

The left vertebral artery is patent.

Skeleton: There is multilevel degenerative change of the cervical
spine, most advanced at C5-C6 and C6-C7. There is grade 1
anterolisthesis of C3 on C4 and C4 on C5. There is no visible canal
hematoma.

Other neck: There is a subcentimeter left thyroid nodule. The soft
tissues are otherwise unremarkable.

Upper chest: There is mild central bronchial wall thickening. There
is a 5 mm right upper lobe pulmonary nodule.

Review of the MIP images confirms the above findings

CTA HEAD FINDINGS

Anterior circulation: There is calcified atherosclerotic plaque in
the bilateral intracranial ICAs resulting in mild-to-moderate
stenosis on the right and no hemodynamically significant stenosis on
the left. The bilateral MCAs are patent. The right A1 segment is
absent, a normal variant. The bilateral ACAs are otherwise patent.

There is a 2 mm inferiorly directed outpouching arising from the
proximal left M1 segment (11-89).

Posterior circulation: The proximal right V4 segment is patent.
There is occlusion of the midportion of the V4 segment with
reconstitution just proximal to the vertebrobasilar junction likely
due to retrograde flow. The left V4 segment is patent. The basilar
artery is patent with mild calcified atherosclerotic plaque.

The bilateral PCAs are patent with moderate stenosis at the origin
of the right P1 segment (11-108).

Venous sinuses: Patent.

Anatomic variants: As above.

Review of the MIP images confirms the above findings
IMPRESSION: 1. Small focus of hypodensity in the right parietal lobe consistent
with evolving ischemia as seen on the prior brain MRI. The
additional small infarcts seen on that study are not well
appreciated
2. Occluded right vertebral artery from the origin through the
distal V2 segment. There is reconstitution of flow in the V3 and
proximal V4 segments with another occlusion of the mid V4 segment.
The basilar artery is patent.
3. Approximately 70-80% stenosis of the proximal right internal
carotid artery.
4. Intracranial atherosclerotic disease resulting in up to mild to
moderate stenosis of the right cavernous ICA and moderate stenosis
at the origin of the right P1 segment.
5. 2 mm inferiorly directed outpouching from the proximal left M1
segment may reflect a small aneurysm or infundibulum.
6. 5 mm right upper lobe pulmonary nodule. 5 mm right solid
pulmonary nodule within the upper lobe. If patient is low risk for
malignancy, no routine follow-up imaging is recommended; if patient
is high risk for malignancy, a non-contrast Chest CT at 12 months is
optional. If performed and the nodule is stable at 12 months, no
further follow-up is recommended. These guidelines do not apply to
immunocompromised patients and patients with cancer. Follow up in
patients with significant comorbidities as clinically warranted. For
lung cancer screening, adhere to Lung-RADS guidelines. Reference:

## 2021-05-28 MED ORDER — CLOPIDOGREL BISULFATE 75 MG PO TABS
75.0000 mg | ORAL_TABLET | Freq: Every day | ORAL | Status: DC
  Administered 2021-05-28: 17:00:00 75 mg via ORAL

## 2021-05-28 MED ORDER — IOHEXOL 350 MG/ML SOLN
75.0000 mL | Freq: Once | INTRAVENOUS | Status: AC | PRN
  Administered 2021-05-28: 75 mL via INTRAVENOUS

## 2021-05-28 MED ORDER — ASPIRIN EC 81 MG PO TBEC: 81.0000 mg | DELAYED_RELEASE_TABLET | Freq: Every day | ORAL | 11 refills | Status: AC

## 2021-05-28 MED ORDER — HYDRALAZINE HCL 20 MG/ML IJ SOLN: 10.0000 mg | Freq: Four times a day (QID) | INTRAMUSCULAR | Status: DC | PRN

## 2021-05-28 MED ORDER — CLOPIDOGREL BISULFATE 75 MG PO TABS: 75.0000 mg | ORAL_TABLET | Freq: Every day | ORAL | 0 refills | Status: AC

## 2021-05-28 NOTE — Evaluation (Signed)
Physical Therapy Evaluation Patient Details Name: Carlos [name]. MRN: [id number] DOB: [birth year] Today's Date: 05/28/2021  History of Present Illness  Pt is an 87 y/o M who presented to the ED on 05/27/21 with c/c of R side facial droop & aphasia. Symptoms resolved on arrival to ED. MRI revealed multifocal acute ischemia within the posterior right parietal lobe. Pt was incidentially found to be covid (+) (pt endorsed having a cold that began ~2 weeks ago). PMH: DM, chronic renal insufficiency, HTN, AAA without rupture, BPH, dyspnea, elevated lipids  Clinical Impression  Pt seen for PT evaluation with pt reporting he was independent without AD prior to admission. On this date pt is independent with bed mobility, transfers, gait & toileting without AD. Sensation & proprioception are intact, as well as heel-to-shin are equal bilaterally. Pt reports he feels he's at his baseline in regards to mobility. No acute PT needs identified at this time & PT to sign off.        Recommendations for follow up therapy are one component of a multi-disciplinary discharge planning process, led by the attending physician.  Recommendations may be updated based on patient status, additional functional criteria and insurance authorization.  Follow Up Recommendations No PT follow up    Assistance Recommended at Discharge None  Functional Status Assessment Patient has not had a recent decline in their functional status  Equipment Recommendations  None recommended by PT    Recommendations for Other Services       Precautions / Restrictions Precautions Precautions: None Restrictions Weight Bearing Restrictions: No      Mobility  Bed Mobility Overal bed mobility: Modified Independent             General bed mobility comments: HOB raised    Transfers Overall transfer level: Independent                 General transfer comment: sit<>stand & stand pivot without AD     Ambulation/Gait Ambulation/Gait assistance: Independent Gait Distance (Feet): 50 Feet (+ 30 ft) Assistive device: None Gait Pattern/deviations: WFL(Within Functional Limits) Gait velocity: WNL     General Gait Details: Pt ambulates 2 laps in room then to bathroom & back without AD independently without LOB noted.  Stairs Stairs:  (Pt declines practicing stair negotiation, reporting he feels fine & at his baseline level of function.)          Wheelchair Mobility    Modified Rankin (Stroke Patients Only)       Balance Overall balance assessment: Independent                                           Pertinent Vitals/Pain Pain Assessment: No/denies pain    Home Living Family/patient expects to be discharged to:: Private residence Living Arrangements: Children Available Help at Discharge: Family;Available 24 hours/day Type of Home: Mobile home Home Access: Stairs to enter Entrance Stairs-Rails: Can reach both;Right;Left Entrance Stairs-Number of Steps: 3   Home Layout: One level        Prior Function Prior Level of Function : Needs assist       Physical Assist : ADLs (physical)   ADLs (physical): IADLs Mobility Comments: Ambulatory without AD ADLs Comments: Assist with IADLs from family; MOD I-I in BADLs     Hand Dominance   Dominant Hand: Right    Extremity/Trunk Assessment   Upper Extremity  Assessment Upper Extremity Assessment: Overall WFL for tasks assessed    Lower Extremity Assessment Lower Extremity Assessment: Overall WFL for tasks assessed    Cervical / Trunk Assessment Cervical / Trunk Assessment: Normal  Communication   Communication: No difficulties  Cognition Arousal/Alertness: Awake/alert Behavior During Therapy: WFL for tasks assessed/performed Overall Cognitive Status: Within Functional Limits for tasks assessed                                 General Comments: Pt is alert and oriented x4,  good attention to task, multi step direction following (ADL task), problem solving, safety/awareness Functional Status Assessment: Patient has not had a recent decline in their functional status      General Comments General comments (skin integrity, edema, etc.): Pt uses bathroom independently during session.    Exercises Other Exercises Other Exercises: education re: role of OT, role of rehab, safe ADL completion, pt and son accept all information, all questions answered   Assessment/Plan    PT Assessment Patient does not need any further PT services  PT Problem List         PT Treatment Interventions      PT Goals (Current goals can be found in the Care Plan section)  Acute Rehab PT Goals Patient Stated Goal: go home PT Goal Formulation: With patient Time For Goal Achievement: 06/11/21 Potential to Achieve Goals: Good    Frequency     Barriers to discharge        Co-evaluation               AM-PAC PT "6 Clicks" Mobility  Outcome Measure Help needed turning from your back to your side while in a flat bed without using bedrails?: None Help needed moving from lying on your back to sitting on the side of a flat bed without using bedrails?: None Help needed moving to and from a bed to a chair (including a wheelchair)?: None Help needed standing up from a chair using your arms (e.g., wheelchair or bedside chair)?: None Help needed to walk in hospital room?: None Help needed climbing 3-5 steps with a railing? : None 6 Click Score: 24    End of Session   Activity Tolerance: Patient tolerated treatment well Patient left: in chair;with family/visitor present;with call bell/phone within reach Nurse Communication: Mobility status      Time: 6979-4801 PT Time Calculation (min) (ACUTE ONLY): 9 min   Charges:   PT Evaluation $PT Eval Low Complexity: 1 Low          Aleda Grana, PT, DPT 05/28/21, 12:39 PM   Sandi Mariscal 05/28/2021, 12:36 PM

## 2021-05-28 NOTE — Progress Notes (Signed)
Pt d/c to home via daughter. IVs removed intact. VSS. Education completed. All belongings sent with pt.

## 2021-05-28 NOTE — Evaluation (Signed)
Occupational Therapy Evaluation Patient Details Name: Carlos [name]. MRN: [id number] DOB: Apr 15, 1931 Today's Date: 05/28/2021   History of Present Illness Pt is an 17 yaer old M presenting to ED with R facial droop and R sided weakness, with speech diffiuclty. MRI shows R parietal lobe ischemia, does not explain R sided symptoms; sspect for ptoenital L hemispheric TIA,  carioembolic event in setting of COVID-19 infection.   Clinical Impression   Chart reviewed, RN cleared pt for participation in OT evaluation. Pt is greeted in bed, agreeable to evaluation, alert and oriented x4. Pt son present throughout. Pt and son report pt lives with pt daughter who assists with ADL/IADL as needed. At time of evaluation, pt is able to perform all ADL tasks with MOD I, including multi step directive tasks. Pt FMC/dexterity appears WFL, vision, cognition all appear to be at baseline. At this time, pt has not OT needs. Please re-consult if there is a change in functional status. Pt is left in bedside chair, NAD, all needs met.      Recommendations for follow up therapy are one component of a multi-disciplinary discharge planning process, led by the attending physician.  Recommendations may be updated based on patient status, additional functional criteria and insurance authorization.   Follow Up Recommendations  No OT follow up    Assistance Recommended at Discharge Intermittent Supervision/Assistance  Functional Status Assessment     Equipment Recommendations  None recommended by OT    Recommendations for Other Services       Precautions / Restrictions Precautions Precautions: Fall Restrictions Weight Bearing Restrictions: No      Mobility Bed Mobility Overal bed mobility: Modified Independent             General bed mobility comments: HOB raised    Transfers Overall transfer level: Modified independent                 General transfer comment: slightly increased time       Balance                                           ADL either performed or assessed with clinical judgement   ADL Overall ADL's : Modified independent                                       General ADL Comments: Pt is MOD I for all ADL tasks; follows multi step directives with good accuracy.     Vision Baseline Vision/History: 1 Wears glasses (for reading) Patient Visual Report: No change from baseline Additional Comments: no change from baseline per patient report, vision screen     Perception Perception Perception: Within Functional Limits   Praxis Praxis Praxis: Intact    Pertinent Vitals/Pain Pain Assessment: No/denies pain     Hand Dominance Right   Extremity/Trunk Assessment Upper Extremity Assessment Upper Extremity Assessment: Overall WFL for tasks assessed   Lower Extremity Assessment Lower Extremity Assessment: Overall WFL for tasks assessed   Cervical / Trunk Assessment Cervical / Trunk Assessment: Normal   Communication Communication Communication: No difficulties   Cognition Arousal/Alertness: Awake/alert Behavior During Therapy: WFL for tasks assessed/performed Overall Cognitive Status: Within Functional Limits for tasks assessed  General Comments: Pt is alert and oriented x4, good attention to task, multi step direction following (ADL task), problem solving, safety/awareness     General Comments  Pt and son report pt is performing tasks at baseline, all symptoms appear to have resolved    Exercises Other Exercises Other Exercises: education re: role of OT, role of rehab, safe ADL completion, pt and son accept all information, all questions answered   Shoulder Instructions      Home Living Family/patient expects to be discharged to:: Private residence Living Arrangements: Children (lives with daugther) Available Help at Discharge: Family;Available 24  hours/day Type of Home: Mobile home Home Access: Stairs to enter     Home Layout: One level     Bathroom Shower/Tub: Tub/shower unit                Lives With: Daughter    Prior Functioning/Environment Prior Level of Function : Needs assist       Physical Assist : ADLs (physical)   ADLs (physical): IADLs   ADLs Comments: Assist with IADLs from family; MOD I-I in BADLs        OT Problem List: Decreased activity tolerance      OT Treatment/Interventions:      OT Goals(Current goals can be found in the care plan section) Acute Rehab OT Goals Patient Stated Goal: to go home OT Goal Formulation: With patient Time For Goal Achievement: 06/11/21 Potential to Achieve Goals: Good  OT Frequency:     Barriers to D/C:            Co-evaluation              AM-PAC OT "6 Clicks" Daily Activity     Outcome Measure Help from another person eating meals?: None Help from another person taking care of personal grooming?: None Help from another person toileting, which includes using toliet, bedpan, or urinal?: None Help from another person bathing (including washing, rinsing, drying)?: None Help from another person to put on and taking off regular upper body clothing?: None Help from another person to put on and taking off regular lower body clothing?: None 6 Click Score: 24   End of Session Equipment Utilized During Treatment: Gait belt Nurse Communication: Mobility status  Activity Tolerance: Patient tolerated treatment well Patient left: in chair;with call bell/phone within reach;with chair alarm set;with family/visitor present  OT Visit Diagnosis: Unsteadiness on feet (R26.81);Other symptoms and signs involving the nervous system (R29.898)                Time: 4656-8127 OT Time Calculation (min): 24 min Charges:  OT General Charges $OT Visit: 1 Visit OT Treatments $Self Care/Home Management : 8-22 mins  Shanon Payor, OTD OTR/L  05/28/21, 11:56 AM

## 2021-05-28 NOTE — Progress Notes (Signed)
*  PRELIMINARY RESULTS* Echocardiogram 2D Echocardiogram has been performed.  Carlos [name] 05/28/2021, 4:04 PM

## 2021-05-28 NOTE — Discharge Summary (Signed)
Discharge Summary  Carlos [name]. ZOX:[id number] DOB: 15-Apr-1931  PCP: Lynnea Ferrier, MD  Admit date: 05/27/2021 Discharge date: 05/28/2021  Time spent: 45 minutes  Recommendations for Outpatient Follow-up:  New medication: Aspirin 81 mg p.o. daily New medication: Plavix 75 mg p.o. daily x3 weeks Cardiology will follow up with patient for 30-day event monitor Vascular surgery will follow up with patient for appointment for right carotid artery stenosis Echocardiogram results are pending.  Attending physician will follow up on these results.  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    Discharge Condition: Improved, being discharged home  Diet recommendation: Heart healthy  Vitals:   05/28/21 1151 05/28/21 1658  BP: 133/68 130/70  Pulse: 72 73  Resp: 16 16  Temp: 98.7 F (37.1 C) 99 F (37.2 C)  SpO2: 99% 99%    History of present illness:  Patient is an 87 year old male with past medical history of diabetes mellitus, stage III chronic kidney disease and hyperlipidemia who presented to the emergency room on afternoon of 11/19 after experiencing right-sided facial droop and difficulty talking as well as left leg weakness.  Symptoms lasted for about 10 minutes and then resolved.  In the emergency room, CT scan of the head unremarkable.   Also found to have incidental COVID.  Patient brought in for further evaluation and work-up.  Hospital Course:  Principal Problem:   Infarction of parietal lobe (HCC)/TIA: Interestingly, MRI noted multifocal right parietal lobe acute ischemia.  However, patient's symptoms are consistent more with left sided brain attack, which is likely a TIA, since no findings on MRI.  Therefore, this is most likely embolic disease.  Full work-up revealed good control of diabetes mellitus with A1c at 6.9.  Lipid panel noted low HDL at 19, LDL at target at 49, but elevated triglycerides at 196.  Patient already on statin and Tricor.  Will patient will follow-up with his PCP to consider adding fish oil on the look at alternative for better triglyceride control.  Seen by PT and OT and speech therapy and no needs long-term.  They have signed off.  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  We will have cardiology set up patient with 30-day event monitor.  In regards to carotid artery stenosis, see below.  Right carotid artery stenosis: Patient underwent CT angiogram which did note 70 to 80% stenosis of the proximal right ICA.  Given that he does have an acute area of ischemia on the right side as well as the significant stenosis, it does need to be managed and addressed.  However still logically, source of both his CVA and TIA were more distal embolic such as a from atrial fibrillation.  Discussed multiple different options with patient and he wanted [facility] [place] which has been given.  They will call him  tomorrow to set up appointment.  Active Problems:   Pure hyperglyceridemia: As above, patient will follow up with his PCP.    Primary osteoarthritis of right hip: Stable.    Essential hypertension: Allowing for permissive hypertension, continue home medications.    CKD (chronic kidney disease)  stage 3, GFR 30-59 ml/min ([place]): At baseline.    BPH with obstruction/lower urinary tract symptoms: Continue home medications    Age-related physical debility   Abdominal aortic aneurysm (AAA) without rupture    COVID-19 virus RNA detected: Incidental infection.  Patient and other members of family have had a cold recently and patient is feeling better from that.  Did not realize it was COVID.  Stable with no hypoxia.  Advised patient to stay isolated especially with Thanksgiving coming up and wear masks to try to minimize or avoid exposing uninfected members of the family.  Overweight: Patient meets criteria BMI greater than 25  Procedures: Echocardiogram: Results pending.  I will follow-up on this.  Consultations: Neurology  Discharge Exam: BP 130/70 (BP Location: Left Arm)   Pulse 73   Temp 99 F (37.2 C)   Resp 16   Ht 5\' 2"  (1.575 m)   Wt 73.6 kg   SpO2 99%   BMI 29.68 kg/m   General: Alert and oriented x3, no acute distress Cardiovascular: Regular rate and rhythm, S1-S2 Respiratory: Clear to auscultation bilaterally  Discharge Instructions You were cared for by a hospitalist during your hospital stay. If you have any questions about your discharge medications or the care you received while you were in the hospital after you are discharged, you can call the unit and asked to speak with the hospitalist on call if the hospitalist that took care of you is not available. Once you are discharged, your primary care physician will handle any further medical issues. Please note that NO REFILLS for any discharge medications will be authorized once you are discharged, as it is imperative that you return to your primary care physician (or establish a relationship with a primary care physician if you do not have one) for your aftercare needs so that they can reassess your need for medications and monitor your lab values.  Discharge Instructions     Diet - low sodium heart healthy    Complete by: As directed    Increase activity slowly   Complete by: As directed       Allergies as of 05/28/2021   No Known Allergies      Medication List     STOP taking these medications    enoxaparin 40 MG/0.4ML injection Commonly known as: LOVENOX       TAKE these medications    aspirin EC 81 MG tablet Take 1 tablet (81 mg total) by mouth daily. Swallow whole.   clopidogrel 75 MG tablet Commonly known as: PLAVIX Take 1 tablet (75 mg total) by mouth daily for 20 days. Start taking on: May 29, 2021   dutasteride 0.5 MG capsule Commonly known as: AVODART Take 0.5 mg by mouth daily.   fenofibrate 145 MG tablet Commonly known as: TRICOR Take 145 mg by mouth daily.   glimepiride 4 MG tablet Commonly known as: AMARYL Take 2-4 mg by mouth See admin instructions. Take 4 mg with breakfast and 2 mg at lunch   lisinopril 20 MG tablet Commonly known as: ZESTRIL Take 10 mg by mouth daily.   multivitamin with minerals Tabs tablet Take 1 tablet by mouth daily.   rosuvastatin 10 MG  tablet Commonly known as: CRESTOR Take 10 mg by mouth daily.   sitaGLIPtin 50 MG tablet Commonly known as: JANUVIA Take 50 mg by mouth daily.   tamsulosin 0.4 MG Caps capsule Commonly known as: FLOMAX Take 0.4 mg by mouth daily.       No Known Allergies  Follow-up Information     Theora Gianotti, NP Follow up.   Specialties: Nurse Practitioner, Cardiology, Radiology Why: Will call you to set up heart monitor Contact information: [address] [place] [address] [id number]         Rosetta Posner, MD Follow up.   Specialties: Vascular Surgery, Cardiology Why: Office will call you to set up follow-up appointment Contact information: [address] [place] [address] [phone]                  The results of significant diagnostics from this hospitalization (including imaging, microbiology, ancillary and laboratory) are  listed below for reference.    Significant Diagnostic Studies: CT ANGIO HEAD NECK W WO CM  Result Date: 05/28/2021 CLINICAL DATA:  Stroke follow-up EXAM: CT ANGIOGRAPHY HEAD AND NECK TECHNIQUE: Multidetector CT imaging of the head and neck was performed using the standard protocol during bolus administration of intravenous contrast. Multiplanar CT image reconstructions and MIPs were obtained to evaluate the vascular anatomy. Carotid stenosis measurements (when applicable) are obtained utilizing NASCET criteria, using the distal internal carotid diameter as the denominator. CONTRAST:  68mL OMNIPAQUE IOHEXOL 350 MG/ML SOLN COMPARISON:  Brain MRI and CT head dated 1 day prior FINDINGS: CT HEAD FINDINGS Brain: There is a small focus of hypodensity in the right parietal lobe consistent with evolving ischemia as seen on the prior brain MRI. The additional small infarcts seen on that study are not well appreciated. There is no new large vessel territorial infarct. There is no acute intracranial hemorrhage or extra-axial fluid collection Parenchymal volume is stable. The ventricles are normal in size. There is no mass lesion. There is no midline shift. Vascular: See below Skull: Normal. Negative for fracture or focal lesion. Sinuses: There is mild mucosal thickening in the paranasal sinuses. Orbits: Bilateral lens implants are in place. The globes and orbits are otherwise unremarkable. Review of the MIP images confirms the above findings CTA NECK FINDINGS Aortic arch: There is mild calcified atherosclerotic plaque in the aortic arch without significant stenosis. No evidence of dissection. The origins of the great vessels are patent. Right carotid system: There is primarily soft plaque at the proximal right internal carotid artery resulting in up to approximately 70-80% stenosis. The distal right internal carotid artery is patent. The right external carotid artery is patent. Left carotid system: There is minimal  calcified atherosclerotic plaque in the proximal left internal carotid artery without hemodynamically significant stenosis. There is no evidence of dissection or aneurysm. Vertebral arteries: The right vertebral artery is occluded from its origin through the V2/V3 junction where there is reconstitution of flow. The left vertebral artery is patent. Skeleton: There is multilevel degenerative change of the cervical spine, most advanced at C5-C6 and C6-C7. There is grade 1 anterolisthesis of C3 on C4 and C4 on C5. There is no visible canal hematoma. Other neck: There is a subcentimeter left thyroid nodule. The soft tissues are otherwise unremarkable. Upper chest: There is mild central bronchial wall thickening. There is a 5 mm right upper lobe pulmonary nodule. Review of the MIP images confirms the above findings CTA HEAD FINDINGS Anterior circulation: There is  calcified atherosclerotic plaque in the bilateral intracranial ICAs resulting in mild-to-moderate stenosis on the right and no hemodynamically significant stenosis on the left. The bilateral MCAs are patent. The right A1 segment is absent, a normal variant. The bilateral ACAs are otherwise patent. There is a 2 mm inferiorly directed outpouching arising from the proximal left M1 segment (11-89). Posterior circulation: The proximal right V4 segment is patent. There is occlusion of the midportion of the V4 segment with reconstitution just proximal to the vertebrobasilar junction likely due to retrograde flow. The left V4 segment is patent. The basilar artery is patent with mild calcified atherosclerotic plaque. The bilateral PCAs are patent with moderate stenosis at the origin of the right P1 segment (11-108). Venous sinuses: Patent. Anatomic variants: As above. Review of the MIP images confirms the above findings IMPRESSION: 1. Small focus of hypodensity in the right parietal lobe consistent with evolving ischemia as seen on the prior brain MRI. The additional  small infarcts seen on that study are not well appreciated 2. 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 There is reconstitution of flow in the V3 and proximal V4 segments with another occlusion of the mid V4 segment. The basilar artery is patent. 3. Approximately 70-80% stenosis of the proximal right internal carotid artery. 4. Intracranial atherosclerotic disease resulting in up to mild to moderate stenosis of the right cavernous ICA and moderate stenosis at the origin of the right P1 segment. 5. 2 mm inferiorly directed outpouching from the proximal left M1 segment may reflect a small aneurysm or infundibulum. 6. 5 mm right upper lobe pulmonary nodule. 5 mm right solid pulmonary nodule within the upper lobe. If patient is low risk for malignancy, no routine follow-up imaging is recommended; if patient is high risk for malignancy, a non-contrast Chest CT at 12 months is optional. If performed and the nodule is stable at 12 months, no further follow-up is recommended. These guidelines do not apply to immunocompromised patients and patients with cancer. Follow up in patients with significant comorbidities as clinically warranted. For lung cancer screening, adhere to Lung-RADS guidelines. Reference: Radiology. 2017; 284(1):228-43. Electronically Signed   By: Valetta Mole M.D.   On: 05/28/2021 13:11   CT HEAD WO CONTRAST  Result Date: 05/27/2021 CLINICAL DATA:  TIA, right facial droop EXAM: CT HEAD WITHOUT CONTRAST TECHNIQUE: Contiguous axial images were obtained from the base of the skull through the vertex without intravenous contrast. COMPARISON:  None. FINDINGS: Brain: No evidence of acute infarction, hemorrhage, hydrocephalus, extra-axial collection or mass lesion/mass effect. Vascular: Atherosclerotic calcifications involving the large vessels of the skull base. No unexpected hyperdense vessel. Skull: Normal. Negative for fracture or focal lesion. Sinuses/Orbits: Mucosal  thickening within the bilateral maxillary sinuses and ethmoid air cells. Other: None. IMPRESSION: 1. No acute intracranial findings. 2. Mild paranasal sinus disease. Electronically Signed   By: Davina Poke D.O.   On: 05/27/2021 16:23   MR BRAIN WO CONTRAST  Result Date: 05/27/2021 CLINICAL DATA:  Transient ischemic attack EXAM: MRI HEAD WITHOUT CONTRAST TECHNIQUE: Multiplanar, multiecho pulse sequences of the brain and surrounding structures were obtained without intravenous contrast. COMPARISON:  None. FINDINGS: Brain: Multifocal acute ischemia within the posterior right parietal lobe. No acute or chronic hemorrhage. There is multifocal hyperintense T2-weighted signal within the white matter. Generalized volume loss without a clear lobar predilection. The midline structures are normal. Vascular: Major flow voids are preserved. Skull and upper cervical spine: Normal calvarium and skull base. Visualized upper cervical  spine and soft tissues are normal. Sinuses/Orbits:No paranasal sinus fluid levels or advanced mucosal thickening. No mastoid or middle ear effusion. Normal orbits. IMPRESSION: Multifocal acute ischemia within the posterior right parietal lobe. No hemorrhage or mass effect. Electronically Signed   By: Ulyses Jarred M.D.   On: 05/27/2021 22:22   DG Chest Port 1 View  Result Date: 05/27/2021 CLINICAL DATA:  Right facial droop, aphasia EXAM: PORTABLE CHEST 1 VIEW COMPARISON:  None. FINDINGS: Two frontal views of the chest demonstrate an unremarkable cardiac silhouette. No acute airspace disease, effusion, or pneumothorax. No acute bony abnormalities. IMPRESSION: 1. No acute intrathoracic process. Electronically Signed   By: Randa Ngo M.D.   On: 05/27/2021 19:34    Microbiology: Recent Results (from the past 240 hour(s))  Resp Panel by RT-PCR (Flu A&B, Covid) Nasopharyngeal Swab     Status: Abnormal   Collection Time: 05/27/21 11:08 PM   Specimen: Nasopharyngeal Swab;  Nasopharyngeal(NP) swabs in vial transport medium  Result Value Ref Range Status   SARS Coronavirus 2 by RT PCR POSITIVE (A) NEGATIVE Final    Comment: RESULT CALLED TO, READ BACK BY AND VERIFIED WITH: AMBER BIESECKER AT 0018 05/28/2021 DLB (NOTE) SARS-CoV-2 target nucleic acids are DETECTED.  The SARS-CoV-2 RNA is generally detectable in upper respiratory specimens during the acute phase of infection. Positive results are indicative of the presence of the identified virus, but do not rule out bacterial infection or co-infection with other pathogens not detected by the test. Clinical correlation with patient history and other diagnostic information is necessary to determine patient infection status. The expected result is Negative.  Fact Sheet for Patients: EntrepreneurPulse.com.au  Fact Sheet for Healthcare Providers: IncredibleEmployment.be  This test is not yet approved or cleared by the [place] FDA and  has been authorized for detection and/or diagnosis of SARS-CoV-2 by FDA under an Emergency Use Authorization (EUA).  This EUA will remain in effect (meaning this test c an be used) for the duration of  the COVID-19 declaration under Section 564(b)(1) of the Act, 21 U.S.C. section 360bbb-3(b)(1), unless the authorization is terminated or revoked sooner.     Influenza A by PCR NEGATIVE NEGATIVE Final   Influenza B by PCR NEGATIVE NEGATIVE Final    Comment: (NOTE) The Xpert Xpress SARS-CoV-2/FLU/RSV plus assay is intended as an aid in the diagnosis of influenza from Nasopharyngeal swab specimens and should not be used as a sole basis for treatment. Nasal washings and aspirates are unacceptable for Xpert Xpress SARS-CoV-2/FLU/RSV testing.  Fact Sheet for Patients: EntrepreneurPulse.com.au  Fact Sheet for Healthcare Providers: IncredibleEmployment.be  This test is not yet approved or cleared by the  [place] FDA and has been authorized for detection and/or diagnosis of SARS-CoV-2 by FDA under an Emergency Use Authorization (EUA). This EUA will remain in effect (meaning this test can be used) for the duration of the COVID-19 declaration under Section 564(b)(1) of the Act, 21 U.S.C. section 360bbb-3(b)(1), unless the authorization is terminated or revoked.  Performed at [facility], [address] [place], [place] [address]      Labs: Basic Metabolic Panel: Recent Labs  Lab 05/27/21 1455 05/28/21 0308  NA 139 139  K 4.8 3.7  CL 107 106  CO2 25 26  GLUCOSE 144* 141*  BUN 26* 24*  CREATININE 1.33* 1.19  CALCIUM 9.2 8.8*  MG 2.1  --    Liver Function Tests: Recent Labs  Lab 05/27/21 1455  AST 36  ALT 20  ALKPHOS 35*  BILITOT 0.7  PROT 7.2  ALBUMIN 4.2   No results for input(s): LIPASE, AMYLASE in the last 168 hours. No results for input(s): AMMONIA in the last 168 hours. CBC: Recent Labs  Lab 05/27/21 1455 05/28/21 0308  WBC 7.8 5.5  NEUTROABS 5.8  --   HGB 13.5 12.4*  HCT 41.2 37.8*  MCV 87.8 88.9  PLT 245 261   Cardiac Enzymes: No results for input(s): CKTOTAL, CKMB, CKMBINDEX, TROPONINI in the last 168 hours. BNP: BNP (last 3 results) No results for input(s): BNP in the last 8760 hours.  ProBNP (last 3 results) No results for input(s): PROBNP in the last 8760 hours.  CBG: Recent Labs  Lab 05/27/21 2242 05/28/21 0847 05/28/21 1149 05/28/21 1655  GLUCAP 173* 131* 173* 116*       Signed:  Annita Brod, MD [facility] 05/28/2021, 6:01 PM

## 2021-05-28 NOTE — Consult Note (Addendum)
Neurology Consultation  Reason for Consult: Stroke Referring Physician: Dr. Gevena Barre  CC: Transient right-sided facial droop  History is obtained from: Chart, patient, family member at bedside  HPI: Carlos [name]. is a 86 y.o. male past medical history of unruptured AAA, BPH, chronic renal insufficiency, diabetes, hyperlipidemia, hypertension presented to the emergency room for evaluation of cute onset of right-sided facial droop, numbness and difficulty walking.  Last known well 12:30 PM yesterday 05/27/2021 when family noted that he had drooping of the right side of the face.  He was also having a difficult time walking with his right side being weak at that time.  Speech was reportedly very low-more hypophonic than dysarthric. Symptoms have since resolved-the total duration of symptoms is noted to be about 10 minutes. Admitted for TIA work-up.  MRI shows multifocal right parietal lobe acute ischemia. No history of atrial fibrillation. Recent diagnosis of COVID-19 infection-has been symptomatic with respiratory symptoms for the last 10 or so days and most recent RT-PCR for COVID-19 is positive yesterday on admission. Denies any current neurological symptoms Denies chest pain shortness of breath nausea vomiting. Denies prior history of stroke  LKW: 12:30 PM 05/27/2021 tpa given?: no, outside the window Premorbid modified Rankin scale (mRS): 0  ROS: Full ROS was performed and is negative except as noted in the HPI.   Past Medical History:  Diagnosis Date   AAA (abdominal aortic aneurysm) without rupture    BPH (benign prostatic hyperplasia)    Chronic renal insufficiency    Diabetes mellitus without complication (HCC)    Dyspnea    Elevated lipids    History of kidney stones    Hypertension    Wears dentures    full upper and lower   Family History  Problem Relation Age of Onset   Coronary artery disease Father    Colon cancer Father    Colon cancer Brother     Social History:   reports that he has never smoked. He has never used smokeless tobacco. He reports that he does not currently use alcohol. He reports that he does not use drugs.  Medications  Current Facility-Administered Medications:    acetaminophen (TYLENOL) tablet 650 mg, 650 mg, Oral, Q4H PRN **OR** acetaminophen (TYLENOL) 160 MG/5ML solution 650 mg, 650 mg, Per Tube, Q4H PRN **OR** acetaminophen (TYLENOL) suppository 650 mg, 650 mg, Rectal, Q4H PRN, Cox, Amy N, DO   benzonatate (TESSALON) capsule 200 mg, 200 mg, Oral, BID PRN, Cox, Amy N, DO, 200 mg at 05/27/21 2323   enoxaparin (LOVENOX) injection 40 mg, 40 mg, Subcutaneous, Q24H, Cox, Amy N, DO, 40 mg at 05/27/21 2323   fenofibrate tablet 160 mg, 160 mg, Oral, Daily, Cox, Amy N, DO, 160 mg at 05/28/21 1002   guaiFENesin (MUCINEX) 12 hr tablet 600 mg, 600 mg, Oral, BID, Cox, Amy N, DO, 600 mg at 05/28/21 1001   hydrALAZINE (APRESOLINE) injection 10 mg, 10 mg, Intravenous, Q6H PRN, Cox, Amy N, DO   insulin aspart (novoLOG) injection 0-15 Units, 0-15 Units, Subcutaneous, TID WC, Cox, Amy N, DO, 2 Units at 05/28/21 1001   insulin aspart (novoLOG) injection 0-5 Units, 0-5 Units, Subcutaneous, QHS, Cox, Amy N, DO   multivitamin with minerals tablet 1 tablet, 1 tablet, Oral, Daily, Cox, Amy N, DO, 1 tablet at 05/28/21 1001   rosuvastatin (CRESTOR) tablet 10 mg, 10 mg, Oral, Daily, Cox, Amy N, DO, 10 mg at 05/28/21 1002   senna-docusate (Senokot-S) tablet 1 tablet, 1 tablet, Oral,  QHS PRN, Cox, Amy N, DO   Exam: Current vital signs: BP 118/68 (BP Location: Left Arm)   Pulse 74   Temp 98.9 F (37.2 C)   Resp 16   Ht 5\' 2"  (1.575 m)   Wt 73.6 kg   SpO2 98%   BMI 29.68 kg/m  Vital signs in last 24 hours: Temp:  [97.6 F (36.4 C)-98.9 F (37.2 C)] 98.9 F (37.2 C) (11/20 0844) Pulse Rate:  [74-86] 74 (11/20 0844) Resp:  [16-21] 16 (11/20 0844) BP: (97-133)/(55-74) 118/68 (11/20 0844) SpO2:  [93 %-100 %] 98 % (11/20  0844) Weight:  [73.5 kg-73.6 kg] 73.6 kg (11/19 2236)  GENERAL: Awake, alert in NAD HEENT: - Normocephalic and atraumatic, dry mm, no LN++, no Thyromegally LUNGS - Clear to auscultation bilaterally with no wheezes CV - S1S2 RRR, no m/r/g, equal pulses bilaterally. ABDOMEN - Soft, nontender, nondistended with normoactive BS Ext: warm, well perfused, intact peripheral pulses, no edema  NEURO:  Mental Status: AA&Ox3  Language: speech is baseline per family-no dysarthria.  Naming, repetition, fluency, and comprehension intact. Cranial Nerves: PERRL EOMI, visual fields full, no facial asymmetry, facial sensation intact, hearing intact, tongue/uvula/soft palate midline, normal sternocleidomastoid and trapezius muscle strength. No evidence of tongue atrophy or fibrillations Motor: 5/5 without drift in all fours Tone: is normal and bulk is normal Sensation- Intact to light touch bilaterally Coordination: FTN intact bilaterally, no ataxia in BLE. Gait-walks with slightly shorter stride but no gross abnormality.  NIHSS-0   Labs I have reviewed labs in epic and the results pertinent to this consultation are:   CBC    Component Value Date/Time   WBC 5.5 05/28/2021 0308   RBC 4.25 05/28/2021 0308   HGB 12.4 (L) 05/28/2021 0308   HCT 37.8 (L) 05/28/2021 0308   PLT 261 05/28/2021 0308   MCV 88.9 05/28/2021 0308   MCH 29.2 05/28/2021 0308   MCHC 32.8 05/28/2021 0308   RDW 12.9 05/28/2021 0308   LYMPHSABS 1.1 05/27/2021 1455   MONOABS 0.6 05/27/2021 1455   EOSABS 0.2 05/27/2021 1455   BASOSABS 0.1 05/27/2021 1455    CMP     Component Value Date/Time   NA 139 05/28/2021 0308   K 3.7 05/28/2021 0308   CL 106 05/28/2021 0308   CO2 26 05/28/2021 0308   GLUCOSE 141 (H) 05/28/2021 0308   BUN 24 (H) 05/28/2021 0308   CREATININE 1.19 05/28/2021 0308   CALCIUM 8.8 (L) 05/28/2021 0308   PROT 7.2 05/27/2021 1455   ALBUMIN 4.2 05/27/2021 1455   AST 36 05/27/2021 1455   ALT 20 05/27/2021  1455   ALKPHOS 35 (L) 05/27/2021 1455   BILITOT 0.7 05/27/2021 1455   GFRNONAA 59 (L) 05/28/2021 0308    Lipid Panel     Component Value Date/Time   CHOL 107 05/28/2021 0308   TRIG 196 (H) 05/28/2021 0308   HDL 19 (L) 05/28/2021 0308   CHOLHDL 5.6 05/28/2021 0308   VLDL 39 05/28/2021 0308   LDLCALC 49 05/28/2021 0308   A1c pending LDL 49  Imaging I have reviewed the images obtained:  MRI examination of the brain-reveals multifocal ischemia in the posterior right parietal lobe.   Assessment: 87 year old male past medical history as above presenting for a brief episode of right facial droop and right-sided weakness along with some speech difficulty.  MRI shows right parietal lobe ischemia which has an embolic look to it but it cannot explain his right-sided symptoms.  I suspect that he  probably had an embolic event where the left hemispheric ischemia was probably transient and gave him the symptoms that he came in with but the right hemispheric strokes are likely currently asymptomatic. Given the COVID-19 infection, high suspicion for cardioembolic event.  NIH at the time of exam-0  Impression: -Right parietal lobe acute ischemic infarction-atheroembolic versus cardioembolic -[id number] infection-has been symptomatic for 10 days.  Not in acute treatment. -Possible left hemispheric TIA-symptoms that brought him and do not localize to the area of the stroke seen on MRI -Seizures were suspected prior to MRI was obtained and EEG was ordered-I have very low suspicion for this being seizures and have canceled the EEG at this time  Recommendations: Frequent neurochecks Telemetry 2D echo CTA head and neck A1c-pending LDL goal less than 70.  He is at goal.  Continue home statin. Aspirin and Plavix for 3 weeks followed by aspirin only. Updated recommendation on dual antiplatelets will be provided after vessel imaging and work-up completion. PT OT Speech therapy Will offer permissive  hypertension-treat only if systolic blood pressure greater than 220 on a as needed basis for the next 48 hours or so. Eventual blood pressure goal on discharge should be 140/90 or below. If telemetry does not show atrial fibrillation, will need outpatient cardiac monitoring. We will follow the results and update recommendations.   Plan discussed in detail with the patient, family member at bedside as well as Dr. [place] Pink over secure chat.   -- Amie Portland, MD Neurologist [facility] Pager: [phone]   ADDENDUM 1700 hrs  CTA head and neck completed and reviewed.  IMPRESSION: 1. Small focus of hypodensity in the right parietal lobe consistent with evolving ischemia as seen on the prior brain MRI. The additional small infarcts seen on that study are not well appreciated 2. 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 There is reconstitution of flow in the V3 and proximal V4 segments with another occlusion of the mid V4 segment. The basilar artery is patent. 3. Approximately 70-80% stenosis of the proximal right internal carotid artery. 4. Intracranial atherosclerotic disease resulting in up to mild to moderate stenosis of the right cavernous ICA and moderate stenosis at the origin of the right P1 segment. 5. 2 mm inferiorly directed outpouching from the proximal left M1 segment may reflect a small aneurysm or infundibulum. 6. 5 mm right upper lobe pulmonary nodule. 5 mm right solid pulmonary nodule within the upper lobe. If patient is low risk for malignancy, no routine follow-up imaging is recommended; if patient is high risk for malignancy, a non-contrast Chest CT at 12 months is optional. If performed and the nodule is stable at 12 months, no further follow-up is recommended. These guidelines do not apply to immunocompromised patients and patients with cancer. Follow up in patients with significant comorbidities as  clinically warranted. For lung cancer screening, adhere to Lung-RADS guidelines. Reference: Radiology. 2017; 284(1):228-43.  The RICA lesion is symptomatic in this case. The vert occlusion is likely chronic.   Echo - pending  IMPRESSION Right parietal cortical infarction RICA stenosis    Updated recs: Given symptomatic Rt ICA, will need revascularization-not emergent but should be fine in the next 2 weeks. If he can get a vascular surgery follow up in the next week and procedure in the time frame above at [facility], that is a reasonable option. The other option will be to consider endovascular stenting with neuro IR at [facility] if they would prefer that or  vascular surgery consult at [facility].  I have discussed this in detail with Dr. Lindwood Qua. He is going to speak with the family. I would still do at least a 30 day heart monitor to r/o Afib as well.  -- Milon Dikes, MD Neurologist [facility] Pager: [phone]

## 2021-05-28 NOTE — Evaluation (Signed)
Speech Language Pathology Evaluation Patient Details Name: Carlos [name]. MRN: [id number] DOB: August 22, 1930 Today's Date: 05/28/2021 Time: [id number] SLP Time Calculation (min) (ACUTE ONLY): 20 min  Problem List:  Patient Active Problem List   Diagnosis Date Noted   COVID-19 virus RNA detected 05/28/2021   TIA (transient ischemic attack) 05/27/2021   Infarction of parietal lobe ([place]) 05/27/2021   Total knee replacement status 02/01/2021   Age-related physical debility 02/08/2020   Abdominal aortic aneurysm (AAA) without rupture 01/14/2020   Pyocele 12/11/2019   Renal cyst 08/13/2019   Urethral calculus 06/25/2019   Myalgia 04/08/2019   Primary osteoarthritis of left knee 12/25/2018   Status post total knee replacement, right 04/04/2018   Controlled type 2 diabetes mellitus with stage 3 chronic kidney disease, without long-term current use of insulin ([place]) 11/04/2017   Bilateral carpal tunnel syndrome 10/28/2016   Essential hypertension 10/20/2015   Primary osteoarthritis of right hip 07/21/2014   CKD (chronic kidney disease) stage 3, GFR 30-59 ml/min (HCC) 07/21/2014   Pure hyperglyceridemia 01/18/2014   Elevated prostate specific antigen (PSA) 01/26/2013   Carcinoma in situ of prostate 01/26/2013   BPH with obstruction/lower urinary tract symptoms 01/26/2013   Past Medical History:  Past Medical History:  Diagnosis Date   AAA (abdominal aortic aneurysm) without rupture    BPH (benign prostatic hyperplasia)    Chronic renal insufficiency    Diabetes mellitus without complication (HCC)    Dyspnea    Elevated lipids    History of kidney stones    Hypertension    Wears dentures    full upper and lower   Past Surgical History:  Past Surgical History:  Procedure Laterality Date   CARPAL TUNNEL RELEASE Right 11/12/2016   Procedure: CARPAL TUNNEL RELEASE;  Surgeon: Leanor Kail, MD;  Location: [facility] ORS;  Service: Orthopedics;  Laterality: Right;   CARPAL TUNNEL  RELEASE Left 08/09/2017   Procedure: OPEN CARPAL TUNNEL RELEASE;  Surgeon: Leanor Kail, MD;  Location: [place];  Service: Orthopedics;  Laterality: Left;  Diabetic - oral meds   CATARACT EXTRACTION W/ INTRAOCULAR LENS  IMPLANT, BILATERAL     KNEE ARTHROPLASTY Left 02/01/2021   Procedure: COMPUTER ASSISTED TOTAL KNEE ARTHROPLASTY;  Surgeon: Dereck Leep, MD;  Location: [facility] ORS;  Service: Orthopedics;  Laterality: Left;   LITHOTRIPSY  09/2018   at [place]  Right 12/2019   TONSILLECTOMY  1960   HPI:  Per 36 H&P "Carlos [name]. is a 87 y.o. male with medical history significant for hyperlipidemia, non-insulin-dependent diabetes mellitus, CKD stage IIIa, hypertension, abdominal aortic aneurysm without rupture, BPH on [place], primary osteoarthritis of the left knee, status post knee replacement on the right, status post total left knee arthroplasty, who presents emergency department for chief concerns of acute onset right-sided facial droop, numbness and difficulty ambulating.     At approximately 12:30/1pm he was sitting down to eat supper when he developed tingling of bilateral nose area, for him this lasted approximately 30 seconds. Daughter noticed the right side of his mouth was sagging and not moving with his attempts to talk. She reports that he was talking and she was able to understand him, only that his voice seemed very low. She reports that his right side appeared to be dragging. She reports that he kept looking at his legs and had difficulty with controlling them.  Daughter reports that her observation of his drooping changes lasted approximately 5 to 10 minutes.  He reports he is never felt this way before.     He reports that for the last couple of weeks, he had had a cold with some cough and sputum. Though he endorsed that it is improved now.  He denied feeling short of breath." MRI Brain 05/27/21 "Multifocal acute ischemia within the posterior right  parietal lobe.  No hemorrhage or mass effect."   Assessment / Plan / Recommendation Clinical Impression  Pt seen for cognitive-linguistic evaluation. Pt evaluated via informal means including direct observation during Neurology evaluation.   Based on today's evaluation, pt basic functional cognitive-linguistic ability is judged to be [facility]. Pt and son deny pt with any changes to speech/language/cognition since initial episode which "lasted a few minutes" per pt. Pt A&Ox4. Pt's speech is fluent, appropriate, and without s/sx dysarthria. Pt demonstrated intact functional auditory comprehension. Functional recall judged to be [facility] as pt able to recall details of MD (Neurology) visit.   No f/u SLP services are warranted at this time.   Pt and son educated re: role of SLP, results of assessment, and SLP POC. Pt and son verbalized understanding/agreement. Attending MD and RN made aware of results and recommendations.   SLP to sign off, please reconsult should pt have additional SLP needs.    SLP Assessment  SLP Recommendation/Assessment: Patient does not need any further Speech Lanaguage Pathology Services SLP Visit Diagnosis: Cognitive communication deficit (R41.841)    Recommendations for follow up therapy are one component of a multi-disciplinary discharge planning process, led by the attending physician.  Recommendations may be updated based on patient status, additional functional criteria and insurance authorization.    Follow Up Recommendations  No SLP follow up    Assistance Recommended at Discharge   (defer to OT/PT)  Functional Status Assessment Patient has not had a recent decline in their functional status  Frequency and Duration N/A      SLP Evaluation Cognition  Arousal/Alertness: Awake/alert Orientation Level: Oriented X4 Memory: Appears intact (recalled specific details of Neurologist evaluation) Problem Solving: Appears intact (verbal) Safety/Judgment: Appears intact        Comprehension  Auditory Comprehension Overall Auditory Comprehension: Appears within functional limits for tasks assessed Yes/No Questions: Within Functional Limits Commands: Within Functional Limits Conversation: Complex (WFL) Interfering Components: Hearing EffectiveTechniques: Increased volume Reading Comprehension Reading Status:  (DNT)    Expression Expression Primary Mode of Expression: Verbal Verbal Expression Overall Verbal Expression: Appears within functional limits for tasks assessed Initiation: No impairment Automatic Speech: Social Response (WFL) Level of Generative/Spontaneous Verbalization: Conversation (WFL) Repetition: No impairment Naming: No impairment Pragmatics: No impairment Written Expression Written Expression:  (DNT)   Oral / Motor  Oral Motor/Sensory Function Overall Oral Motor/Sensory Function: Within functional limits Motor Speech Overall Motor Speech: Appears within functional limits for tasks assessed Respiration: Within functional limits Phonation: Normal Resonance: Within functional limits Articulation: Within functional limitis Intelligibility: Intelligible Motor Planning: Witnin functional limits    Clyde Canterbury, M.S., CCC-SLP Speech-Language Pathologist [facility] [phone] (ASCOM)                    Woodroe Chen 05/28/2021, 11:26 AM

## 2021-05-29 LAB — ECHOCARDIOGRAM COMPLETE
AR max vel: 2.2 cm2
AV Peak grad: 4.5 mmHg
Ao pk vel: 1.06 m/s
Area-P 1/2: 3.07 cm2
Height: 62 in
S' Lateral: 3 cm
Weight: 2596.14 oz

## 2021-06-06 DIAGNOSIS — Z8673 Personal history of transient ischemic attack (TIA), and cerebral infarction without residual deficits: Secondary | ICD-10-CM | POA: Insufficient documentation

## 2021-06-06 DIAGNOSIS — I700 Atherosclerosis of aorta: Secondary | ICD-10-CM | POA: Insufficient documentation

## 2021-06-06 DIAGNOSIS — R918 Other nonspecific abnormal finding of lung field: Secondary | ICD-10-CM | POA: Insufficient documentation

## 2021-06-14 NOTE — Progress Notes (Signed)
Primary Care Provider: Lynnea Ferrier, MD [facility] Cardiologist: None -> Seen by Dr. Tennis Must: On 10/22/2016 Electrophysiologist: None  Clinic Note: Chief Complaint  Patient presents with   New Patient (Initial Visit)    Establish care with provider for 2 concurrent TIAs per daughter. Medications verbally reviewed with patient.   ===================================  ASSESSMENT/PLAN   Problem List Items Addressed This Visit       Cardiology Problems   Hypertriglyceridemia (Chronic)    Triglycerides 196.  Somewhat elevated, but not dramatically.  Has been on baseline low-dose statin plus fenofibrate.  Statin just increased which should hopefully help bring TG down.      Carotid artery stenosis, symptomatic, right    Right-sided carotid disease.  Referred to vascular surgery.  I suspect there may be consideration for TCAR.  Relatively low cardiovascular risk procedure/surgery.  He is able to carry on at least 6-8 METS based on story.  He does have some exercise intolerance but simply that he cannot do what he used to do 10 years ago.  This is probably more related to somewhat unsteady gait and deconditioning.  No active cardiac symptoms, and relatively normal echocardiogram, no clear indication for ischemic evaluation prior to procedure.  Would not likely change management.  Patient is already on high-dose statin, aspirin and Plavix.      Essential hypertension (Chronic)    Blood pressure is pretty stable on current dose of lisinopril.      Abdominal aortic aneurysm (AAA) without rupture (Chronic)    Relatively small caliber dilation noted.  At age 29, I suspect that this probably would not become an issue. He is on antihypertensives and now increased statin dose along with fenofibrate.  Also Januvia and Amaryl for diabetes.  Being followed elsewhere.      TIA (transient ischemic attack) - Primary (Chronic)    Felt to have had a left-sided TIA with right-sided  carotid disease.  Therefore this consideration for possible second etiology.  Referred for evaluation for possible atrial fibrillation.  He is completely asymptomatic, but would probably not know if he was in or out of A. fib, if you are not tachycardic.   Continue on aspirin Plavix, this would need to be for carotid artery disease regardless.  48-hour monitor ordered by PCP, but referred for 30-day monitoring.  We will place back-to-back Zio patch 14-day monitors for 30-day monitor.      Relevant Orders   EKG 12-Lead   LONG TERM MONITOR-LIVE TELEMETRY (3-14 DAYS)   LONG TERM MONITOR-LIVE TELEMETRY (3-14 DAYS)    ===================================  HPI:    Carlos [name]. is a 87 y.o. male with PMH notable for DM-2, HTN, HLD, CKD-3B, AAA, and DJD along with recent diagnosis of TIA who is being seen today for the evaluation of TIA/CAROTID ARTERY DISEASE at the request of Lynnea Ferrier, MD.  Carlos [name]. was seen on 10/22/2016 by Dr. Juliann Pares [facility]) in follow-up preop evaluation for carpal tunnel surgery.  He had been evaluated with a Myoview and Echo (see below) done to evaluate for possible anginal symptoms along with dyspnea - Both Normal.  -- He did not follow-up.   Recent Hospitalizations:  [facility] ER 05/27/2021 ->  presented with speech difficulty along with walking difficulty.  Symptoms apparently lasted only 10 minutes and then resolved.  MRI found multifocal Right Parietal Ischemia.  CT angiogram showed occluded Right Vertebral Artery with 70 to 80% right ICA occlusion. ->  Interesting findings given that symptoms were more consistent with left-sided TIA with no MRI evidence of stroke.  Thought to be embolic. Recommended Cardiology (WITH 30 D MONITOR), Vascular surgery & Neurology follow-up. Echo essentially normal. Started on aspirin Plavix Had no known positive COVID-19 test.  Symptoms began 1 month prior.  He was seen on June 07, 2020 by his  PCP Rosuvastatin increased to 20 mg daily along with aspirin and Plavix. 48 hr Holter Monitor ordered by PCP (through [facility]) Referred to Cardiology, Neurology and Vascular Surgery.  Reviewed  CV studies:    The following studies were reviewed today: (if available, images/films reviewed: From Epic Chart or Care Everywhere)  TTE 05/28/2021: EF 50 to 55%.  Low normal function.  Poor quality, therefore not able to fully assess regional wall motion.  GR 1 DD.  Aortic valve sclerosis but no stenosis.  Normal MV.  Normal RV and RA.  Unable to assess RVP.  [facility] - [facility], 10/17/2016)  Eugenie Birks Myoview (Cardiolite)  : EV 57%. Normal Wall Motion No Ischemia or Infarction.  Normal Study.  TTE: Normal LVEF 50-55%.  Normal RV function.  Mild MR and TR.  Mild biatrial enlargement.  No valvular stenosis.   Interval History:   Carlos [name]. presents here with his daughter who really is the one who provides most of the history.  He himself is a very poor historian.  He cannot recall the events that led up to his emergency room visit.  He really cannot recall any untoward symptoms prior to his episode.  The daughter indicates that she saw him with facial droop and altered mental status of dysarthria.  By the time EMS arrived, symptoms have resolved.  Has been no further symptoms.  He is relatively active for an elderly gentleman and denies any notable symptoms.  He does have some intermittent dizziness but that seems to be more vertiginous in nature.  Enjoys doing chores around the house and yard work.  He mentions, and his daughter corroborates that he may get tired and short of breath after his been doing yard work for a while, but not unexpected given his advanced age.  Just some off-and-on palpitations but no definitive exertional dyspnea or chest pain/pressure with usual routine activities.  No CHF symptoms.  Nothing to suggest an arrhythmia.  CV Review of Symptoms (Summary)  with the exception of TIA episode Cardiovascular ROS: positive for - dyspnea on exertion and mild exertional/exercise intolerance with prolonged activity.  Off-and-on palpitations. negative for - chest pain, edema, irregular heartbeat, orthopnea, palpitations, paroxysmal nocturnal dyspnea, rapid heart rate, shortness of breath, or syncope/near syncope.  No further TIA or amaurosis fugax symptoms.  Only 1 episode.  No residual effect.  We will  REVIEWED OF SYSTEMS   Review of Systems  Constitutional:  Negative for malaise/fatigue (Just an exercise intolerance.) and weight loss.  HENT:  Negative for nosebleeds.   Respiratory:  Negative for cough and shortness of breath.   Cardiovascular:  Negative for claudication.  Gastrointestinal:  Negative for blood in stool and melena.  Genitourinary:  Negative for dysuria and hematuria.  Musculoskeletal:  Positive for falls (Has been tended to fall little more lately with poor balance.) and joint pain.  Neurological:  Positive for dizziness. Negative for focal weakness, weakness and headaches.  Psychiatric/Behavioral:  Positive for memory loss. Negative for depression. The patient is not nervous/anxious and does not have insomnia.   All other systems reviewed and are negative.  I  have reviewed and (if needed) personally updated the patient's problem list, medications, allergies, past medical and surgical history, social and family history.   PAST MEDICAL HISTORY   Past Medical History:  Diagnosis Date   AAA (abdominal aortic aneurysm) without rupture    BPH (benign prostatic hyperplasia)    Chronic renal insufficiency    Diabetes mellitus without complication (HCC)    Dyspnea    Elevated lipids    History of kidney stones    Hypertension    Hypertriglyceridemia 01/18/2014   Wears dentures    full upper and lower    PAST SURGICAL HISTORY   Past Surgical History:  Procedure Laterality Date   CARPAL TUNNEL RELEASE Right 11/12/2016    Procedure: CARPAL TUNNEL RELEASE;  Surgeon: Leanor Kail, MD;  Location: [facility];  Service: Orthopedics;  Laterality: Right;   CARPAL TUNNEL RELEASE Left 08/09/2017   Procedure: OPEN CARPAL TUNNEL RELEASE;  Surgeon: Leanor Kail, MD;  Location: [facility];  Service: Orthopedics;  Laterality: Left;  Diabetic - oral meds   CATARACT EXTRACTION W/ INTRAOCULAR LENS  IMPLANT, BILATERAL     KNEE ARTHROPLASTY Left 02/01/2021   Procedure: COMPUTER ASSISTED TOTAL KNEE ARTHROPLASTY;  Surgeon: Dereck Leep, MD;  Location: [facility];  Service: Orthopedics;  Laterality: Left;   LITHOTRIPSY  09/2018   at [place]  Right 12/2019   TONSILLECTOMY  1960    There is no immunization history for the selected administration types on file for this patient.  MEDICATIONS/ALLERGIES   Current Meds  Medication Sig   aspirin EC 81 MG tablet Take 1 tablet (81 mg total) by mouth daily. Swallow whole.   clopidogrel (PLAVIX) 75 MG tablet Take 1 tablet (75 mg total) by mouth daily for 20 days.   dutasteride (AVODART) 0.5 MG capsule Take 0.5 mg by mouth daily.   fenofibrate (TRICOR) 145 MG tablet Take 145 mg by mouth daily.   glimepiride (AMARYL) 4 MG tablet Take 2-4 mg by mouth See admin instructions. Take 4 mg with breakfast and 2 mg at lunch   lisinopril (PRINIVIL,ZESTRIL) 20 MG tablet Take 10 mg by mouth daily.   Multiple Vitamin (MULTIVITAMIN WITH MINERALS) TABS tablet Take 1 tablet by mouth daily.   rosuvastatin (CRESTOR) 20 MG tablet Take by mouth daily.   sitaGLIPtin (JANUVIA) 50 MG tablet Take 50 mg by mouth daily.   tamsulosin (FLOMAX) 0.4 MG CAPS capsule Take 0.4 mg by mouth daily.    No Known Allergies  SOCIAL HISTORY/FAMILY HISTORY   Reviewed in Epic:   Social History   Tobacco Use   Smoking status: Never   Smokeless tobacco: Never  Vaping Use   Vaping Use: Never used  Substance Use Topics   Alcohol use: Not Currently    Comment: may have a beer a few times each year    Drug use: Never   Social History   Social History Narrative   Not on file   Family History  Problem Relation Age of Onset   Coronary artery disease Father    Colon cancer Father    Colon cancer Brother     OBJCTIVE -PE, EKG, labs   Wt Readings from Last 3 Encounters:  06/15/21 165 lb (74.8 kg)  05/27/21 162 lb 4.1 oz (73.6 kg)  02/01/21 163 lb 2.3 oz (74 kg)    Physical Exam: BP 130/70 (BP Location: Left Arm, Patient Position: Sitting, Cuff Size: Normal)   Pulse 82   Ht 5\' 2"  (1.575  m)   Wt 165 lb (74.8 kg)   SpO2 97%   BMI 30.18 kg/m  Physical Exam Vitals reviewed.  Constitutional:      General: He is not in acute distress.    Appearance: Normal appearance. He is obese. He is not ill-appearing or toxic-appearing.     Comments: Relatively healthy-appearing 87 year old gentleman.  Mostly truncal obesity  HENT:     Head: Normocephalic and atraumatic.  Neck:     Vascular: Normal carotid pulses. No carotid bruit, hepatojugular reflux or JVD.  Cardiovascular:     Rate and Rhythm: Normal rate and regular rhythm.     Chest Wall: PMI is not displaced.     Pulses: Normal pulses.     Heart sounds: S1 normal and S2 normal. Murmur (~2/6 SEM) heard.    No friction rub. No gallop.  Pulmonary:     Effort: Pulmonary effort is normal.     Breath sounds: Normal breath sounds.  Musculoskeletal:        General: No swelling or tenderness.     Cervical back: Normal range of motion and neck supple.  Skin:    Coloration: Skin is not jaundiced or pale.  Neurological:     General: No focal deficit present.     Mental Status: He is alert.     Motor: No weakness.     Gait: Gait normal.     Comments: Awake and alert to person and place, not sure of the exact location.  Not aware of date/time.  Apparently this is baseline.  Psychiatric:        Mood and Affect: Mood normal.     Comments: Very poor historian.  Does not really answer any questions.  Most history provided by daughter.     Adult ECG Report  Rate: 82;  Rhythm: normal sinus rhythm and 1 degree AVB with incomplete RBBB.  Otherwise normal axis, normal durations. ;   Narrative Interpretation: Borderline  Recent Labs: Reviewed Lab Results  Component Value Date   CHOL 107 05/28/2021   HDL 19 (L) 05/28/2021   LDLCALC 49 05/28/2021   TRIG 196 (H) 05/28/2021   CHOLHDL 5.6 05/28/2021   Lab Results  Component Value Date   CREATININE 1.19 05/28/2021   BUN 24 (H) 05/28/2021   NA 139 05/28/2021   K 3.7 05/28/2021   CL 106 05/28/2021   CO2 26 05/28/2021   CBC Latest Ref Rng & Units 05/28/2021 05/27/2021 01/23/2021  WBC 4.0 - 10.5 K/uL 5.5 7.8 7.0  Hemoglobin 13.0 - 17.0 g/dL 12.4(L) 13.5 13.7  Hematocrit 39.0 - 52.0 % 37.8(L) 41.2 41.5  Platelets 150 - 400 K/uL 261 245 232    Lab Results  Component Value Date   HGBA1C 6.9 (H) 05/28/2021   No results found for: TSH  ==================================================  COVID-19 Education: The signs and symptoms of COVID-19 were discussed with the patient and how to seek care for testing (follow up with PCP or arrange E-visit).    I spent a total of 32 minutes with the patient spent in direct patient consultation.  Additional time spent with chart review  / charting (studies, outside notes, etc): 30 min (25 min pre-charting - 3 studies, multiple clinic & hospital notes reviewed) Total Time: 62 min  Current medicines are reviewed at length with the patient today.  (+/- concerns) N/A  This visit occurred during the SARS-CoV-2 public health emergency.  Safety protocols were in place, including screening questions prior to the visit, additional usage  of staff PPE, and extensive cleaning of exam room while observing appropriate contact time as indicated for disinfecting solutions.  Notice: This dictation was prepared with Dragon dictation along with smart phrase technology. Any transcriptional errors that result from this process are unintentional and may  not be corrected upon review.   Studies Ordered:  Orders Placed This Encounter  Procedures   LONG TERM MONITOR-LIVE TELEMETRY (3-14 DAYS)   LONG TERM MONITOR-LIVE TELEMETRY (3-14 DAYS)   EKG 12-Lead     Patient Instructions / Medication Changes & Studies & Tests Ordered   Patient Instructions  Medication Instructions:  Your physician recommends that you continue on your current medications as directed. Please refer to the Current Medication list given to you today.  *If you need a refill on your cardiac medications before your next appointment, please call your pharmacy*   Lab Work: NONE If you have labs (blood work) drawn today and your tests are completely normal, you will receive your results only by: [place] (if you have MyChart) OR A paper copy in the mail If you have any lab test that is abnormal or we need to change your treatment, we will call you to review the results.   Testing/Procedures: ZIO AT Long term monitor-Live Telemetry  Follow-Up: At [facility], you and your health needs are our priority.  As part of our continuing mission to provide you with exceptional heart care, we have created designated Provider Care Teams.  These Care Teams include your primary Cardiologist (physician) and Advanced Practice Providers (APPs -  Physician Assistants and Nurse Practitioners) who all work together to provide you with the care you need, when you need it.  We recommend signing up for the patient portal called "MyChart".  Sign up information is provided on this After Visit Summary.  MyChart is used to connect with patients for Virtual Visits (Telemedicine).  Patients are able to view lab/test results, encounter notes, upcoming appointments, etc.  Non-urgent messages can be sent to your provider as well.   To learn more about what you can do with MyChart, go to [email or web address].    Your next appointment:   4-6 week(s)  The format for your next appointment:    In Person  Provider:   Glenetta Hew, MD{     Glenetta Hew, M.D., M.S. Interventional Cardiologist   Pager # [phone] Phone # [phone] [address] [place], [place] [address]   Thank you for choosing [facility] [place]!!

## 2021-06-15 ENCOUNTER — Encounter: Payer: Self-pay | Admitting: Cardiology

## 2021-06-15 ENCOUNTER — Ambulatory Visit: Payer: Medicare PPO

## 2021-06-15 ENCOUNTER — Ambulatory Visit (INDEPENDENT_AMBULATORY_CARE_PROVIDER_SITE_OTHER): Payer: Medicare PPO

## 2021-06-15 ENCOUNTER — Other Ambulatory Visit: Payer: Self-pay

## 2021-06-15 ENCOUNTER — Ambulatory Visit: Payer: Medicare PPO | Admitting: Cardiology

## 2021-06-15 VITALS — BP 130/70 | HR 82 | Ht 62.0 in | Wt 165.0 lb

## 2021-06-15 DIAGNOSIS — I7143 Infrarenal abdominal aortic aneurysm, without rupture: Secondary | ICD-10-CM | POA: Diagnosis not present

## 2021-06-15 DIAGNOSIS — I10 Essential (primary) hypertension: Secondary | ICD-10-CM

## 2021-06-15 DIAGNOSIS — I6521 Occlusion and stenosis of right carotid artery: Secondary | ICD-10-CM

## 2021-06-15 DIAGNOSIS — G459 Transient cerebral ischemic attack, unspecified: Secondary | ICD-10-CM

## 2021-06-15 DIAGNOSIS — E781 Pure hyperglyceridemia: Secondary | ICD-10-CM

## 2021-06-15 NOTE — Patient Instructions (Addendum)
Medication Instructions:  Your physician recommends that you continue on your current medications as directed. Please refer to the Current Medication list given to you today.  *If you need a refill on your cardiac medications before your next appointment, please call your pharmacy*   Lab Work: NONE If you have labs (blood work) drawn today and your tests are completely normal, you will receive your results only by: [name] (if you have MyChart) OR A paper copy in the mail If you have any lab test that is abnormal or we need to change your treatment, we will call you to review the results.   Testing/Procedures: ZIO AT Long term monitor-Live Telemetry  Your physician has requested you wear a ZIO patch monitor for 28 days.  This is a single patch monitor. [name] supplies one patch monitor per enrollment. Additional  stickers are not available.  Please do not apply patch if you will be having a Nuclear Stress Test, Echocardiogram, Cardiac CT, MRI,  or Chest Xray during the period you would be wearing the monitor. The patch cannot be worn during  these tests. You cannot remove and re-apply the ZIO AT patch monitor.  Your ZIO patch monitor will be mailed 3 day USPS to your address on file. It may take 3-5 days to  receive your monitor after you have been enrolled.  Once you have received your monitor, please review the enclosed instructions. Your monitor has  already been registered assigning a specific monitor serial # to you.   Billing and Patient Assistance Program information  Carlos [name] has been supplied with any insurance information on record for billing. [name] offers a sliding scale Patient Assistance Program for patients without insurance, or whose  insurance does not completely cover the cost of the ZIO patch monitor. You must apply for the  Patient Assistance Program to qualify for the discounted rate. To apply, call [name] at [phone],  select option 4, select  option 2 , ask to apply for the Patient Assistance Program, (you can request an  interpreter if needed). [name] will ask your household income and how many people are in your  household. [name] will quote your out-of-pocket cost based on this information. They will also be able  to set up a 12 month interest free payment plan if needed.  Applying the monitor   Shave hair from upper left chest.  Hold the abrader disc by orange tab. Rub the abrader in 40 strokes over left upper chest as indicated in  your monitor instructions.  Clean area with 4 enclosed alcohol pads. Use all pads to ensure the area is cleaned thoroughly. Let  dry.  Apply patch as indicated in monitor instructions. Patch will be placed under collarbone on left side of  chest with arrow pointing upward.  Rub patch adhesive wings for 2 minutes. Remove the white label marked "1". Remove the white label  marked "2". Rub patch adhesive wings for 2 additional minutes.  While looking in a mirror, press and release button in center of patch. A small green light will flash 3-4  times. This will be your only indicator that the monitor has been turned on.  Do not shower for the first 24 hours. You may shower after the first 24 hours.  Press the button if you feel a symptom. You will hear a small click. Record Date, Time and Symptom in  the Patient Log.   Starting the Gateway  In your kit there is a Optometrist  the size of a cellphone. This is Airline pilot. It transmits all your  recorded data to [name]. This box must always stay within 10 feet of you. Open the box and push the *  button. There will be a light that blinks orange and then green a few times. When the light stops  blinking, the Gateway is connected to the ZIO patch. Call [name] at [phone] to confirm your monitor is transmitting.  Returning your monitor  Remove your patch and place it inside the [name]. In the lower half of the Gateway there is a white   bag with prepaid postage on it. Place Gateway in bag and seal. Mail package back to [name] as soon as  possible. Your physician should have your final report approximately 7 days after you have mailed back  your monitor. Call [name] at [phone] if you have questions regarding your ZIO AT  patch monitor. Call them immediately if you see an orange light blinking on your monitor.  If your monitor falls off in less than 4 days, contact our Monitor department at [phone]. If your  monitor becomes loose or falls off after 4 days call [name] at [phone] for suggestions on  securing your monitor  Follow-Up: At [facility], you and your health needs are our priority.  As part of our continuing mission to provide you with exceptional heart care, we have created designated Provider Care Teams.  These Care Teams include your primary Cardiologist (physician) and Advanced Practice Providers (APPs -  Physician Assistants and Nurse Practitioners) who all work together to provide you with the care you need, when you need it.  We recommend signing up for the patient portal called "MyChart".  Sign up information is provided on this After Visit Summary.  MyChart is used to connect with patients for Virtual Visits (Telemedicine).  Patients are able to view lab/test results, encounter notes, upcoming appointments, etc.  Non-urgent messages can be sent to your provider as well.   To learn more about what you can do with MyChart, go to [email or web address].    Your next appointment:   4-6 week(s)  The format for your next appointment:   In Person  Provider:   Glenetta Hew, MD{

## 2021-06-17 ENCOUNTER — Encounter: Payer: Self-pay | Admitting: Cardiology

## 2021-06-17 DIAGNOSIS — G459 Transient cerebral ischemic attack, unspecified: Secondary | ICD-10-CM

## 2021-06-17 NOTE — Assessment & Plan Note (Signed)
Blood pressure is pretty stable on current dose of lisinopril.

## 2021-06-17 NOTE — Assessment & Plan Note (Signed)
Felt to have had a left-sided TIA with right-sided carotid disease.  Therefore this consideration for possible second etiology.  Referred for evaluation for possible atrial fibrillation.  He is completely asymptomatic, but would probably not know if he was in or out of A. fib, if you are not tachycardic.   Continue on aspirin Plavix, this would need to be for carotid artery disease regardless.  48-hour monitor ordered by PCP, but referred for 30-day monitoring.  We will place back-to-back Zio patch 14-day monitors for 30-day monitor.

## 2021-06-17 NOTE — Assessment & Plan Note (Signed)
Triglycerides 196.  Somewhat elevated, but not dramatically.  Has been on baseline low-dose statin plus fenofibrate.  Statin just increased which should hopefully help bring TG down.

## 2021-06-17 NOTE — Assessment & Plan Note (Signed)
Relatively small caliber dilation noted.  At age 86, I suspect that this probably would not become an issue. He is on antihypertensives and now increased statin dose along with fenofibrate.  Also Januvia and Amaryl for diabetes.  Being followed elsewhere.

## 2021-06-17 NOTE — Assessment & Plan Note (Signed)
Right-sided carotid disease.  Referred to vascular surgery.  I suspect there may be consideration for TCAR.  Relatively low cardiovascular risk procedure/surgery.  He is able to carry on at least 6-8 METS based on story.  He does have some exercise intolerance but simply that he cannot do what he used to do 10 years ago.  This is probably more related to somewhat unsteady gait and deconditioning.  No active cardiac symptoms, and relatively normal echocardiogram, no clear indication for ischemic evaluation prior to procedure.  Would not likely change management.  Patient is already on high-dose statin, aspirin and Plavix.

## 2021-06-29 ENCOUNTER — Encounter (INDEPENDENT_AMBULATORY_CARE_PROVIDER_SITE_OTHER): Payer: Self-pay | Admitting: Nurse Practitioner

## 2021-06-29 ENCOUNTER — Ambulatory Visit (INDEPENDENT_AMBULATORY_CARE_PROVIDER_SITE_OTHER): Payer: Medicare PPO | Admitting: Vascular Surgery

## 2021-06-29 ENCOUNTER — Encounter (INDEPENDENT_AMBULATORY_CARE_PROVIDER_SITE_OTHER): Payer: Self-pay | Admitting: Vascular Surgery

## 2021-06-29 ENCOUNTER — Other Ambulatory Visit: Payer: Self-pay

## 2021-06-29 VITALS — BP 98/63 | HR 95 | Resp 16 | Wt 166.0 lb

## 2021-06-29 DIAGNOSIS — I7143 Infrarenal abdominal aortic aneurysm, without rupture: Secondary | ICD-10-CM | POA: Diagnosis not present

## 2021-06-29 DIAGNOSIS — I63239 Cerebral infarction due to unspecified occlusion or stenosis of unspecified carotid arteries: Secondary | ICD-10-CM

## 2021-06-29 DIAGNOSIS — I6389 Other cerebral infarction: Secondary | ICD-10-CM | POA: Diagnosis not present

## 2021-06-29 DIAGNOSIS — I10 Essential (primary) hypertension: Secondary | ICD-10-CM

## 2021-06-29 DIAGNOSIS — E781 Pure hyperglyceridemia: Secondary | ICD-10-CM

## 2021-06-29 NOTE — Progress Notes (Signed)
MRN : [id number]  Carlos [name]. is a 87 y.o. (July 26, 1930) male who presents with chief complaint of check cat scan.  History of Present Illness:  The patient is seen for evaluation of carotid stenosis. The carotid stenosis was identified after a CVA.  CT angiogram was obtained which shows >80% right ICA stenosis.  The CVA occurred May 27, 2021.  Patient was seen by neurology on 05/28/2021 at which time the MRI showed a multifocal right parietal lobe acute CVA.  Atrial fibrillation.  The patient denies interval amaurosis fugax. There is no recent history of TIA symptoms or focal motor deficits. There is a prior documented CVA as noted above, no other findings on MRI.  There is no history of migraine headaches. There is no history of seizures.  The patient is taking enteric-coated aspirin 81 mg daily as well as Plavix 75 mg p.o. daily.  Patient also has a history of an asymptomatic abdominal aortic aneurysm.  This was identified in Dec 02, 2019 by a CT abdomen and pelvis without contrast.  Aneurysm measures 3.2 cm at that time  The patient denies a history of coronary artery disease, no recent episodes of angina or shortness of breath. The patient denies PAD or claudication symptoms. There is a history of hyperlipidemia which is being treated with a statin.    Current Meds  Medication Sig   aspirin EC 81 MG tablet Take 1 tablet (81 mg total) by mouth daily. Swallow whole.   dutasteride (AVODART) 0.5 MG capsule Take 0.5 mg by mouth daily.   fenofibrate (TRICOR) 145 MG tablet Take 145 mg by mouth daily.   glimepiride (AMARYL) 4 MG tablet Take 2-4 mg by mouth See admin instructions. Take 4 mg with breakfast and 2 mg at lunch   lisinopril (PRINIVIL,ZESTRIL) 20 MG tablet Take 10 mg by mouth daily.   Multiple Vitamin (MULTIVITAMIN WITH MINERALS) TABS tablet Take 1 tablet by mouth daily.   rosuvastatin (CRESTOR) 20 MG tablet Take by mouth daily.   sitaGLIPtin (JANUVIA) 50 MG  tablet Take 50 mg by mouth daily.   tamsulosin (FLOMAX) 0.4 MG CAPS capsule Take 0.4 mg by mouth daily.    Past Medical History:  Diagnosis Date   AAA (abdominal aortic aneurysm) without rupture    BPH (benign prostatic hyperplasia)    Chronic renal insufficiency    Diabetes mellitus without complication (HCC)    Dyspnea    Elevated lipids    History of kidney stones    Hypertension    Hypertriglyceridemia 01/18/2014   Wears dentures    full upper and lower    Past Surgical History:  Procedure Laterality Date   CARPAL TUNNEL RELEASE Right 11/12/2016   Procedure: CARPAL TUNNEL RELEASE;  Surgeon: Erin Sons, MD;  Location: [facility];  Service: Orthopedics;  Laterality: Right;   CARPAL TUNNEL RELEASE Left 08/09/2017   Procedure: OPEN CARPAL TUNNEL RELEASE;  Surgeon: Erin Sons, MD;  Location: [facility];  Service: Orthopedics;  Laterality: Left;  Diabetic - oral meds   CATARACT EXTRACTION W/ INTRAOCULAR LENS  IMPLANT, BILATERAL     KNEE ARTHROPLASTY Left 02/01/2021   Procedure: COMPUTER ASSISTED TOTAL KNEE ARTHROPLASTY;  Surgeon: Donato Heinz, MD;  Location: [facility];  Service: Orthopedics;  Laterality: Left;   LITHOTRIPSY  09/2018   at [facility]   testical  Right 12/2019   TONSILLECTOMY  1960    Social History Social History   Tobacco Use   Smoking status: Never  Smokeless tobacco: Never  Vaping Use   Vaping Use: Never used  Substance Use Topics   Alcohol use: Not Currently    Comment: may have a beer a few times each year   Drug use: Never    Family History Family History  Problem Relation Age of Onset   Coronary artery disease Father    Colon cancer Father    Colon cancer Brother     No Known Allergies   REVIEW OF SYSTEMS (Negative unless checked)  Constitutional: [] Weight loss  [] Fever  [] Chills Cardiac: [] Chest pain   [] Chest pressure   [] Palpitations   [] Shortness of breath when laying flat   [] Shortness of breath with  exertion. Vascular:  [] Pain in legs with walking   [] Pain in legs at rest  [] History of DVT   [] Phlebitis   [] Swelling in legs   [] Varicose veins   [] Non-healing ulcers Pulmonary:   [] Uses home oxygen   [] Productive cough   [] Hemoptysis   [] Wheeze  [] COPD   [] Asthma Neurologic:  [] Dizziness   [] Seizures   [x] History of stroke   [] History of TIA  [] Aphasia   [] Vissual changes   [] Weakness or numbness in arm   [] Weakness or numbness in leg Musculoskeletal:   [] Joint swelling   [x] Joint pain   [] Low back pain Hematologic:  [] Easy bruising  [] Easy bleeding   [] Hypercoagulable state   [] Anemic Gastrointestinal:  [] Diarrhea   [] Vomiting  [] Gastroesophageal reflux/heartburn   [] Difficulty swallowing. Genitourinary:  [x] Chronic kidney disease   [] Difficult urination  [] Frequent urination   [] Blood in urine Skin:  [] Rashes   [] Ulcers  Psychological:  [] History of anxiety   []  History of major depression.  Physical Examination  Vitals:   06/29/21 1045  BP: 98/63  Pulse: 95  Resp: 16  Weight: 166 lb (75.3 kg)   Body mass index is 30.36 kg/m. Gen: WD/WN, NAD Head: [place]/AT, No temporalis wasting.  Ear/Nose/Throat: Hearing grossly intact, nares w/o erythema or drainage Eyes: PER, EOMI, sclera nonicteric.  Neck: Supple, no masses.  No bruit or JVD.  Pulmonary:  Good air movement, no audible wheezing, no use of accessory muscles.  Cardiac: RRR, normal S1, S2, no Murmurs. Vascular:   right carotid bruit Vessel Right Left  Radial Palpable Palpable  Carotid Palpable Palpable  Gastrointestinal: soft, non-distended. No guarding/no peritoneal signs.  Musculoskeletal: M/S 5/5 throughout.  No visible deformity.  Neurologic: CN 2-12 intact. Pain and light touch intact in extremities.  Symmetrical.  Speech is fluent. Motor exam as listed above. Psychiatric: Judgment intact, Mood & affect appropriate for pt's clinical situation. Dermatologic: No rashes or ulcers noted.  No changes consistent with  cellulitis.   CBC Lab Results  Component Value Date   WBC 5.5 05/28/2021   HGB 12.4 (L) 05/28/2021   HCT 37.8 (L) 05/28/2021   MCV 88.9 05/28/2021   PLT 261 05/28/2021    BMET    Component Value Date/Time   NA 139 05/28/2021 0308   K 3.7 05/28/2021 0308   CL 106 05/28/2021 0308   CO2 26 05/28/2021 0308   GLUCOSE 141 (H) 05/28/2021 0308   BUN 24 (H) 05/28/2021 0308   CREATININE 1.19 05/28/2021 0308   CALCIUM 8.8 (L) 05/28/2021 0308   GFRNONAA 59 (L) 05/28/2021 0308   CrCl cannot be calculated (Patient's most recent lab result is older than the maximum 21 days allowed.).  COAG Lab Results  Component Value Date   INR 1.0 05/27/2021    Radiology No results found.  Assessment/Plan 1. Carotid artery stenosis with cerebral infarction [facility]) Recommend:  The patient is symptomatic with respect to the right carotid stenosis.  The patient has a lesion that is >90% and associated with a parietal CVA on 05/27/2021.  Patient's CT angiography of the carotid arteries confirms >90% right ICA stenosis.  The anatomical considerations support surgery over stenting.  This was discussed in detail with the patient.  Krames pamphlet was also given to the patient.  The patient is cleared by Dr Herbie Baltimore for surgery and so he will be scheduled for surgery.  The risks, benefits and alternative therapies were reviewed in detail with the patient.  All questions were answered.  The patient agrees to proceed with surgery of the right carotid artery.  Continue antiplatelet therapy as prescribed stop Plavix three days before surgery. Continue management of CAD, HTN and Hyperlipidemia. Healthy heart diet, encouraged exercise at least 4 times per week.    2. Infarction of parietal lobe (HCC) Continue antiplatelet therapy as prescribed stop Plavix three days before surgery.  3. Infrarenal abdominal aortic aneurysm (AAA) without rupture No surgery or intervention at this time. The patient has an  asymptomatic abdominal aortic aneurysm that is greater than 4 cm but less than 5 cm in maximal diameter.  I have discussed the natural history of abdominal aortic aneurysm and the small risk of rupture for aneurysm less than 5 cm in size.  However, as these small aneurysms tend to enlarge over time, continued surveillance with ultrasound or CT scan is mandatory.  I have also discussed optimizing medical management with hypertension and lipid control and the importance of abstinence from tobacco.  The patient is also encouraged to exercise a minimum of 30 minutes 4 times a week.  Should the patient develop new onset abdominal or back pain or signs of peripheral embolization they are instructed to seek medical attention immediately and to alert the physician providing care that they have an aneurysm.  The patient voices their understanding. I have scheduled the patient to return in 6 months with an aortic duplex.   4. Essential hypertension Continue antihypertensive medications as already ordered, these medications have been reviewed and there are no changes at this time.   5. Hypertriglyceridemia Continue statin as ordered and reviewed, no changes at this time     Levora Dredge, MD  06/29/2021 11:05 AM

## 2021-06-29 NOTE — H&P (View-Only) (Signed)
° ° °MRN : [id number] ° °Carlos A Mcquary Jr. is a 87 y.o. (07/09/1930) male who presents with chief complaint of check cat scan. ° °History of Present Illness:  °The patient is seen for evaluation of carotid stenosis. The carotid stenosis was identified after a CVA.  CT angiogram was obtained which shows >80% right ICA stenosis. ° °The CVA occurred May 27, 2021.  Patient was seen by neurology on 05/28/2021 at which time the MRI showed a multifocal right parietal lobe acute CVA.  Atrial fibrillation. ° °The patient denies interval amaurosis fugax. There is no recent history of TIA symptoms or focal motor deficits. There is a prior documented CVA as noted above, no other findings on MRI. ° °There is no history of migraine headaches. There is no history of seizures. ° °The patient is taking enteric-coated aspirin 81 mg daily as well as Plavix 75 mg p.o. daily. ° °Patient also has a history of an asymptomatic abdominal aortic aneurysm.  This was identified in Dec 02, 2019 by a CT abdomen and pelvis without contrast.  Aneurysm measures 3.2 cm at that time ° °The patient denies a history of coronary artery disease, no recent episodes of angina or shortness of breath. °The patient denies PAD or claudication symptoms. °There is a history of hyperlipidemia which is being treated with a statin.   ° °Current Meds  °Medication Sig  ° aspirin EC 81 MG tablet Take 1 tablet (81 mg total) by mouth daily. Swallow whole.  ° dutasteride (AVODART) 0.5 MG capsule Take 0.5 mg by mouth daily.  ° fenofibrate (TRICOR) 145 MG tablet Take 145 mg by mouth daily.  ° glimepiride (AMARYL) 4 MG tablet Take 2-4 mg by mouth See admin instructions. Take 4 mg with breakfast and 2 mg at lunch  ° lisinopril (PRINIVIL,ZESTRIL) 20 MG tablet Take 10 mg by mouth daily.  ° Multiple Vitamin (MULTIVITAMIN WITH MINERALS) TABS tablet Take 1 tablet by mouth daily.  ° rosuvastatin (CRESTOR) 20 MG tablet Take by mouth daily.  ° sitaGLIPtin (JANUVIA) 50 MG  tablet Take 50 mg by mouth daily.  ° tamsulosin (FLOMAX) 0.4 MG CAPS capsule Take 0.4 mg by mouth daily.  ° ° °Past Medical History:  °Diagnosis Date  ° AAA (abdominal aortic aneurysm) without rupture   ° BPH (benign prostatic hyperplasia)   ° Chronic renal insufficiency   ° Diabetes mellitus without complication (HCC)   ° Dyspnea   ° Elevated lipids   ° History of kidney stones   ° Hypertension   ° Hypertriglyceridemia 01/18/2014  ° Wears dentures   ° full upper and lower  ° ° °Past Surgical History:  °Procedure Laterality Date  ° CARPAL TUNNEL RELEASE Right 11/12/2016  ° Procedure: CARPAL TUNNEL RELEASE;  Surgeon: Kernodle, Harold, MD;  Location: [facility];  Service: Orthopedics;  Laterality: Right;  ° CARPAL TUNNEL RELEASE Left 08/09/2017  ° Procedure: OPEN CARPAL TUNNEL RELEASE;  Surgeon: Kernodle, Harold, MD;  Location: [facility];  Service: Orthopedics;  Laterality: Left;  Diabetic - oral meds  ° CATARACT EXTRACTION W/ INTRAOCULAR LENS  IMPLANT, BILATERAL    ° KNEE ARTHROPLASTY Left 02/01/2021  ° Procedure: COMPUTER ASSISTED TOTAL KNEE ARTHROPLASTY;  Surgeon: Hooten, James P, MD;  Location: [facility];  Service: Orthopedics;  Laterality: Left;  ° LITHOTRIPSY  09/2018  ° at [facility]  ° testical  Right 12/2019  ° TONSILLECTOMY  1960  ° ° °Social History °Social History  ° °Tobacco Use  ° Smoking status: Never  °   Smokeless tobacco: Never  °Vaping Use  ° Vaping Use: Never used  °Substance Use Topics  ° Alcohol use: Not Currently  °  Comment: may have a beer a few times each year  ° Drug use: Never  ° ° °Family History °Family History  °Problem Relation Age of Onset  ° Coronary artery disease Father   ° Colon cancer Father   ° Colon cancer Brother   ° ° °No Known Allergies ° ° °REVIEW OF SYSTEMS (Negative unless checked) ° °Constitutional: []Weight loss  []Fever  []Chills °Cardiac: []Chest pain   []Chest pressure   []Palpitations   []Shortness of breath when laying flat   []Shortness of breath with  exertion. °Vascular:  []Pain in legs with walking   []Pain in legs at rest  []History of DVT   []Phlebitis   []Swelling in legs   []Varicose veins   []Non-healing ulcers °Pulmonary:   []Uses home oxygen   []Productive cough   []Hemoptysis   []Wheeze  []COPD   []Asthma °Neurologic:  []Dizziness   []Seizures   [x]History of stroke   []History of TIA  []Aphasia   []Vissual changes   []Weakness or numbness in arm   []Weakness or numbness in leg °Musculoskeletal:   []Joint swelling   [x]Joint pain   []Low back pain °Hematologic:  []Easy bruising  []Easy bleeding   []Hypercoagulable state   []Anemic °Gastrointestinal:  []Diarrhea   []Vomiting  []Gastroesophageal reflux/heartburn   []Difficulty swallowing. °Genitourinary:  [x]Chronic kidney disease   []Difficult urination  []Frequent urination   []Blood in urine °Skin:  []Rashes   []Ulcers  °Psychological:  []History of anxiety   [] History of major depression. ° °Physical Examination ° °Vitals:  ° 06/29/21 1045  °BP: 98/63  °Pulse: 95  °Resp: 16  °Weight: 166 lb (75.3 kg)  ° °Body mass index is 30.36 kg/m². °Gen: WD/WN, NAD °Head: [place]/AT, No temporalis wasting.  °Ear/Nose/Throat: Hearing grossly intact, nares w/o erythema or drainage °Eyes: PER, EOMI, sclera nonicteric.  °Neck: Supple, no masses.  No bruit or JVD.  °Pulmonary:  Good air movement, no audible wheezing, no use of accessory muscles.  °Cardiac: RRR, normal S1, S2, no Murmurs. °Vascular:   right carotid bruit °Vessel Right Left  °Radial Palpable Palpable  °Carotid Palpable Palpable  °Gastrointestinal: soft, non-distended. No guarding/no peritoneal signs.  °Musculoskeletal: M/S 5/5 throughout.  No visible deformity.  °Neurologic: CN 2-12 intact. Pain and light touch intact in extremities.  Symmetrical.  Speech is fluent. Motor exam as listed above. °Psychiatric: Judgment intact, Mood & affect appropriate for pt's clinical situation. °Dermatologic: No rashes or ulcers noted.  No changes consistent with  cellulitis. ° ° °CBC °Lab Results  °Component Value Date  ° WBC 5.5 05/28/2021  ° HGB 12.4 (L) 05/28/2021  ° HCT 37.8 (L) 05/28/2021  ° MCV 88.9 05/28/2021  ° PLT 261 05/28/2021  ° ° °BMET °   °Component Value Date/Time  ° NA 139 05/28/2021 0308  ° K 3.7 05/28/2021 0308  ° CL 106 05/28/2021 0308  ° CO2 26 05/28/2021 0308  ° GLUCOSE 141 (H) 05/28/2021 0308  ° BUN 24 (H) 05/28/2021 0308  ° CREATININE 1.19 05/28/2021 0308  ° CALCIUM 8.8 (L) 05/28/2021 0308  ° GFRNONAA 59 (L) 05/28/2021 0308  ° °CrCl cannot be calculated (Patient's most recent lab result is older than the maximum 21 days allowed.). ° °COAG °Lab Results  °Component Value Date  ° INR 1.0 05/27/2021  ° ° °Radiology °No results found. ° ° °  Assessment/Plan 1. Carotid artery stenosis with cerebral infarction [facility]) Recommend:  The patient is symptomatic with respect to the right carotid stenosis.  The patient has a lesion that is >90% and associated with a parietal CVA on 05/27/2021.  Patient's CT angiography of the carotid arteries confirms >90% right ICA stenosis.  The anatomical considerations support surgery over stenting.  This was discussed in detail with the patient.  Krames pamphlet was also given to the patient.  The patient is cleared by Dr Herbie Baltimore for surgery and so he will be scheduled for surgery.  The risks, benefits and alternative therapies were reviewed in detail with the patient.  All questions were answered.  The patient agrees to proceed with surgery of the right carotid artery.  Continue antiplatelet therapy as prescribed stop Plavix three days before surgery. Continue management of CAD, HTN and Hyperlipidemia. Healthy heart diet, encouraged exercise at least 4 times per week.    2. Infarction of parietal lobe (HCC) Continue antiplatelet therapy as prescribed stop Plavix three days before surgery.  3. Infrarenal abdominal aortic aneurysm (AAA) without rupture No surgery or intervention at this time. The patient has an  asymptomatic abdominal aortic aneurysm that is greater than 4 cm but less than 5 cm in maximal diameter.  I have discussed the natural history of abdominal aortic aneurysm and the small risk of rupture for aneurysm less than 5 cm in size.  However, as these small aneurysms tend to enlarge over time, continued surveillance with ultrasound or CT scan is mandatory.  I have also discussed optimizing medical management with hypertension and lipid control and the importance of abstinence from tobacco.  The patient is also encouraged to exercise a minimum of 30 minutes 4 times a week.  Should the patient develop new onset abdominal or back pain or signs of peripheral embolization they are instructed to seek medical attention immediately and to alert the physician providing care that they have an aneurysm.  The patient voices their understanding. I have scheduled the patient to return in 6 months with an aortic duplex.   4. Essential hypertension Continue antihypertensive medications as already ordered, these medications have been reviewed and there are no changes at this time.   5. Hypertriglyceridemia Continue statin as ordered and reviewed, no changes at this time     Levora Dredge, MD  06/29/2021 11:05 AM

## 2021-07-04 ENCOUNTER — Telehealth (INDEPENDENT_AMBULATORY_CARE_PROVIDER_SITE_OTHER): Payer: Self-pay

## 2021-07-04 NOTE — Telephone Encounter (Signed)
Spoke with the patient's daughter and gave the information regarding his surgery on 07/12/21 for a RCEA with Dr. Gilda Crease. Pre-op is scheduled for a 07/06/21 phone call between 8-1 pm and covid testing on 07/11/21 between 8-12 pm at the [facility]. Pre-surgical instructions were discussed and will be mailed.

## 2021-07-06 ENCOUNTER — Other Ambulatory Visit: Payer: Self-pay

## 2021-07-06 ENCOUNTER — Telehealth: Payer: Self-pay | Admitting: *Deleted

## 2021-07-06 ENCOUNTER — Other Ambulatory Visit
Admission: RE | Admit: 2021-07-06 | Discharge: 2021-07-06 | Disposition: A | Discharge status: Home / Self Care | Payer: Medicare PPO | Source: Ambulatory Visit | Attending: Vascular Surgery | Admitting: Vascular Surgery

## 2021-07-06 ENCOUNTER — Encounter: Payer: Self-pay | Admitting: Vascular Surgery

## 2021-07-06 ENCOUNTER — Other Ambulatory Visit (INDEPENDENT_AMBULATORY_CARE_PROVIDER_SITE_OTHER): Payer: Self-pay | Admitting: Nurse Practitioner

## 2021-07-06 DIAGNOSIS — I63239 Cerebral infarction due to unspecified occlusion or stenosis of unspecified carotid arteries: Secondary | ICD-10-CM

## 2021-07-06 NOTE — Telephone Encounter (Signed)
° °  Patient Name: Carlos [name].  DOB: [birth year] MRN: [id number]  Primary Cardiologist: None  Chart reviewed as part of pre-operative protocol coverage.   Received request to hold Plavix for 5 days at 5:00 PM for CEA with procedure scheduled on 07/12/2021.  Upon reviewing the chart, the patient's Plavix is managed by neurology for TIA.  I attempted to contact the preop team at [facility], though was unable to reach them via phone.  I have reached out to the patient's primary cardiologist to discuss the patient's Plavix in an effort to not delay in the patient's procedure.  After discussion with his cardiologist, from a cardiac perspective, there is no contraindication to stopping the patient's Plavix.   Ultimately, the decision to stop Plavix will need to come from the patient's neurologist. I will route the message back to Quentin Mulling, NP with the preop team at [facility] to assume care. I will also route him an Epic chat message to updating him of this recommendation in an effort to not delay the patient's procedure.    Eula Listen, PA-C 07/06/2021, 5:27 PM

## 2021-07-06 NOTE — Patient Instructions (Addendum)
Your procedure is scheduled on: Wednesday July 12, 2021 Report to Day Surgery inside [facility] 2nd floor.  To find out your arrival time please call [phone] between 1PM - 3PM on Tuesday July 11, 2021.  Remember: Instructions that are not followed completely may result in serious medical risk,  up to and including death, or upon the discretion of your surgeon and anesthesiologist your  surgery may need to be rescheduled.     _X__ 1. Do not eat food or drink fluids after midnight the night before your procedure.                 No chewing gum or hard candies.   __X__2.  On the morning of surgery brush your teeth with toothpaste and water, you                may rinse your mouth with mouthwash if you wish.  Do not swallow any toothpaste or mouthwash.     _X__ 3.  No Alcohol for 24 hours before or after surgery.   _X__ 4.  Do Not Smoke or use e-cigarettes For 24 Hours Prior to Your Surgery.                 Do not use any chewable tobacco products for at least 6 hours prior to                 Surgery.  _X__  5.  Do not use any recreational drugs (marijuana, cocaine, heroin, ecstasy, MDMA or other)                For at least one week prior to your surgery.  Combination of these drugs with anesthesia                May have life threatening results.  __X__6.  Notify your doctor if there is any change in your medical condition      (cold, fever, infections).     Do not wear jewelry, make-up, hairpins, clips or nail polish. Do not wear lotions, powders, or perfumes. You may wear deodorant. Do not shave 48 hours prior to surgery. Men may shave face and neck. Do not bring valuables to the hospital.    [facility] is not responsible for any belongings or valuables.  Contacts, dentures or bridgework may not be worn into surgery. Leave your suitcase in the car. After surgery it may be brought to your room. For patients admitted to the hospital, discharge  time is determined by your treatment team.   Patients discharged the day of surgery will not be allowed to drive home.   Make arrangements for someone to be with you for the first 24 hours of your Same Day Discharge.   __X__ Take these medicines the morning of surgery with A SIP OF WATER:    1.  dutasteride (AVODART) 0.5 MG  2. fenofibrate (TRICOR) 145 MG  3. rosuvastatin (CRESTOR) 20 MG  4.  5.  6.  ____ Fleet Enema (as directed)   __X__ Use CHG Soap (or wipes) as directed  ____ Use Benzoyl Peroxide Gel as instructed  ____ Use inhalers on the day of surgery  ____ Stop metformin 2 days prior to surgery    ____ Take 1/2 of usual insulin dose the night before surgery. No insulin the morning          of surgery.   __X__ Stop clopidogrel (PLAVIX) 75 MG 3 days prior to your  surgery as instructed by your doctor.   __X__ One Week prior to surgery- Stop Anti-inflammatories such as Ibuprofen, Aleve, Advil, Motrin, meloxicam (MOBIC), diclofenac, etodolac, ketorolac, Toradol, Daypro, piroxicam, Goody's or BC powders. OK TO USE TYLENOL IF NEEDED   __X__ Do not start any new supplements until after surgery.    ____ Bring C-Pap to the hospital.    If you have any questions regarding your pre-procedure instructions,  Please call Pre-admit Testing at [phone]

## 2021-07-06 NOTE — Telephone Encounter (Signed)
Request received for patient to hold Plavix x 5 days, will continue aspirin through perioperative course. Dr. Herbie Baltimore last saw the patient in the office on 06/15/21 and provided the following assessment:   Right-sided carotid disease.  Referred to vascular surgery.  I suspect there may be consideration for TCAR. Relatively low cardiovascular risk procedure/surgery.  He is able to carry on at least 6-8 METS based on story.  He does have some exercise intolerance but simply that he cannot do what he used to do 10 years ago.  This is probably more related to somewhat unsteady gait and deconditioning.  No active cardiac symptoms, and relatively normal echocardiogram, no clear indication for ischemic evaluation prior to procedure.  Would not likely change management.  Patient is already on high-dose statin, aspirin and Plavix.  However, we need specific agreement that patient may hold Plavix for 5 days preoperatively. Surgery is scheduled for 07/12/20. Routing to Dr. Herbie Baltimore for agreement.

## 2021-07-06 NOTE — Telephone Encounter (Signed)
Request for pre-operative cardiac clearance Received: Today Carlos Kitchens, NP  P Cv Div Preop Callback Request for pre-operative cardiac clearance:     1. What type of surgery is being performed?  ENDARTERECTOMY CAROTID   2. When is this surgery scheduled?  07/12/2021     3. Are there any medications that need to be held prior to surgery?  ASA + CLOPIDOGREL  **Surgeon requesting to HOLD clopidogrel x 5 days preoperatively. Will continue ASA throughout the perioperative course.   4. Practice name and name of physician performing surgery?  Performing surgeon: Dr. [name], MD  Requesting clearance: Honor Loh, FNP-C       5. Anesthesia type (none, local, MAC, general)? General   6. What is the office phone and fax number?    Phone: [phone]  Fax: [phone]   ATTENTION: Unable to create telephone message as per your standard workflow. Directed by [facility] providers to send requests for cardiac clearance to this pool for appropriate distribution to provider covering pre-operative clearances.   Honor Loh, MSN, APRN, FNP-C, CEN  [facility]  Peri-operative Services Nurse Practitioner  Phone: [phone]  07/06/21 4:54 PM

## 2021-07-07 ENCOUNTER — Other Ambulatory Visit: Payer: Self-pay

## 2021-07-07 ENCOUNTER — Other Ambulatory Visit
Admission: RE | Admit: 2021-07-07 | Discharge: 2021-07-07 | Disposition: A | Discharge status: Home / Self Care | Payer: Medicare PPO | Source: Ambulatory Visit | Attending: Vascular Surgery | Admitting: Vascular Surgery

## 2021-07-07 DIAGNOSIS — Z01812 Encounter for preprocedural laboratory examination: Secondary | ICD-10-CM | POA: Diagnosis present

## 2021-07-07 DIAGNOSIS — I63239 Cerebral infarction due to unspecified occlusion or stenosis of unspecified carotid arteries: Secondary | ICD-10-CM | POA: Diagnosis not present

## 2021-07-07 LAB — CBC WITH DIFFERENTIAL/PLATELET
Abs Immature Granulocytes: 0.06 10*3/uL (ref 0.00–0.07)
Basophils Absolute: 0 10*3/uL (ref 0.0–0.1)
Basophils Relative: 0 %
Eosinophils Absolute: 0.4 10*3/uL (ref 0.0–0.5)
Eosinophils Relative: 7 %
HCT: 40.9 % (ref 39.0–52.0)
Hemoglobin: 13.3 g/dL (ref 13.0–17.0)
Immature Granulocytes: 1 %
Lymphocytes Relative: 18 %
Lymphs Abs: 1 10*3/uL (ref 0.7–4.0)
MCH: 28.7 pg (ref 26.0–34.0)
MCHC: 32.5 g/dL (ref 30.0–36.0)
MCV: 88.3 fL (ref 80.0–100.0)
Monocytes Absolute: 0.4 10*3/uL (ref 0.1–1.0)
Monocytes Relative: 8 %
Neutro Abs: 3.9 10*3/uL (ref 1.7–7.7)
Neutrophils Relative %: 66 %
Platelets: 237 10*3/uL (ref 150–400)
RBC: 4.63 MIL/uL (ref 4.22–5.81)
RDW: 14.1 % (ref 11.5–15.5)
WBC: 5.8 10*3/uL (ref 4.0–10.5)
nRBC: 0 % (ref 0.0–0.2)

## 2021-07-07 LAB — BASIC METABOLIC PANEL
Anion gap: 8 (ref 5–15)
BUN: 30 mg/dL — ABNORMAL HIGH (ref 8–23)
CO2: 27 mmol/L (ref 22–32)
Calcium: 9.7 mg/dL (ref 8.9–10.3)
Chloride: 106 mmol/L (ref 98–111)
Creatinine, Ser: 1.39 mg/dL — ABNORMAL HIGH (ref 0.61–1.24)
GFR, Estimated: 49 mL/min — ABNORMAL LOW (ref >60)
Glucose, Bld: 151 mg/dL — ABNORMAL HIGH (ref 70–99)
Potassium: 4.2 mmol/L (ref 3.5–5.1)
Sodium: 141 mmol/L (ref 135–145)

## 2021-07-07 NOTE — Telephone Encounter (Signed)
Agree with Eula Listen, PA-c  Bryan Lemma, MD

## 2021-07-11 ENCOUNTER — Other Ambulatory Visit
Admission: RE | Admit: 2021-07-11 | Discharge: 2021-07-11 | Disposition: A | Discharge status: Home / Self Care | Payer: Medicare PPO | Source: Ambulatory Visit | Attending: Vascular Surgery | Admitting: Vascular Surgery

## 2021-07-11 DIAGNOSIS — Z20822 Contact with and (suspected) exposure to covid-19: Secondary | ICD-10-CM | POA: Insufficient documentation

## 2021-07-11 DIAGNOSIS — Z01812 Encounter for preprocedural laboratory examination: Secondary | ICD-10-CM | POA: Insufficient documentation

## 2021-07-11 DIAGNOSIS — Z01818 Encounter for other preprocedural examination: Secondary | ICD-10-CM

## 2021-07-11 LAB — SARS CORONAVIRUS 2 (TAT 6-24 HRS): SARS Coronavirus 2: NEGATIVE

## 2021-07-12 ENCOUNTER — Other Ambulatory Visit: Payer: Self-pay

## 2021-07-12 ENCOUNTER — Encounter: Payer: Self-pay | Admitting: Vascular Surgery

## 2021-07-12 ENCOUNTER — Inpatient Hospital Stay: Payer: Medicare PPO | Admitting: Urgent Care

## 2021-07-12 ENCOUNTER — Encounter
Admission: RE | Disposition: A | Discharge status: Home / Self Care | Payer: Self-pay | Source: Home / Self Care | Attending: Vascular Surgery

## 2021-07-12 ENCOUNTER — Inpatient Hospital Stay
Admission: RE | Admit: 2021-07-12 | Discharge: 2021-07-13 | DRG: 039 | Disposition: A | Discharge status: Home / Self Care | Payer: Medicare PPO | Attending: Vascular Surgery | Admitting: Vascular Surgery

## 2021-07-12 DIAGNOSIS — I129 Hypertensive chronic kidney disease with stage 1 through stage 4 chronic kidney disease, or unspecified chronic kidney disease: Secondary | ICD-10-CM | POA: Diagnosis present

## 2021-07-12 DIAGNOSIS — Z8249 Family history of ischemic heart disease and other diseases of the circulatory system: Secondary | ICD-10-CM

## 2021-07-12 DIAGNOSIS — I7143 Infrarenal abdominal aortic aneurysm, without rupture: Secondary | ICD-10-CM | POA: Diagnosis present

## 2021-07-12 DIAGNOSIS — I6521 Occlusion and stenosis of right carotid artery: Principal | ICD-10-CM | POA: Diagnosis present

## 2021-07-12 DIAGNOSIS — Z8673 Personal history of transient ischemic attack (TIA), and cerebral infarction without residual deficits: Secondary | ICD-10-CM | POA: Diagnosis not present

## 2021-07-12 DIAGNOSIS — E781 Pure hyperglyceridemia: Secondary | ICD-10-CM | POA: Diagnosis present

## 2021-07-12 DIAGNOSIS — I4891 Unspecified atrial fibrillation: Secondary | ICD-10-CM | POA: Diagnosis present

## 2021-07-12 DIAGNOSIS — I2510 Atherosclerotic heart disease of native coronary artery without angina pectoris: Secondary | ICD-10-CM | POA: Diagnosis present

## 2021-07-12 DIAGNOSIS — Z87442 Personal history of urinary calculi: Secondary | ICD-10-CM | POA: Diagnosis not present

## 2021-07-12 DIAGNOSIS — Z7982 Long term (current) use of aspirin: Secondary | ICD-10-CM | POA: Diagnosis not present

## 2021-07-12 DIAGNOSIS — Z7902 Long term (current) use of antithrombotics/antiplatelets: Secondary | ICD-10-CM | POA: Diagnosis not present

## 2021-07-12 DIAGNOSIS — E785 Hyperlipidemia, unspecified: Secondary | ICD-10-CM | POA: Diagnosis present

## 2021-07-12 DIAGNOSIS — Z79899 Other long term (current) drug therapy: Secondary | ICD-10-CM | POA: Diagnosis not present

## 2021-07-12 DIAGNOSIS — Z7984 Long term (current) use of oral hypoglycemic drugs: Secondary | ICD-10-CM | POA: Diagnosis not present

## 2021-07-12 DIAGNOSIS — N189 Chronic kidney disease, unspecified: Secondary | ICD-10-CM | POA: Diagnosis present

## 2021-07-12 DIAGNOSIS — E1122 Type 2 diabetes mellitus with diabetic chronic kidney disease: Secondary | ICD-10-CM | POA: Diagnosis present

## 2021-07-12 DIAGNOSIS — N400 Enlarged prostate without lower urinary tract symptoms: Secondary | ICD-10-CM | POA: Diagnosis present

## 2021-07-12 DIAGNOSIS — Z972 Presence of dental prosthetic device (complete) (partial): Secondary | ICD-10-CM

## 2021-07-12 DIAGNOSIS — Z20822 Contact with and (suspected) exposure to covid-19: Secondary | ICD-10-CM | POA: Diagnosis present

## 2021-07-12 HISTORY — DX: Atherosclerosis of aorta: I70.0

## 2021-07-12 HISTORY — DX: Long term (current) use of antithrombotics/antiplatelets: Z79.02

## 2021-07-12 HISTORY — DX: Complete loss of teeth, unspecified cause, unspecified class: K08.109

## 2021-07-12 HISTORY — PX: ENDARTERECTOMY: SHX5162

## 2021-07-12 HISTORY — DX: Type 2 diabetes mellitus without complications: E11.9

## 2021-07-12 LAB — GLUCOSE, CAPILLARY
Glucose-Capillary: 180 mg/dL — ABNORMAL HIGH (ref 70–99)
Glucose-Capillary: 170 mg/dL — ABNORMAL HIGH (ref 70–99)
Glucose-Capillary: 236 mg/dL — ABNORMAL HIGH (ref 70–99)
Glucose-Capillary: 237 mg/dL — ABNORMAL HIGH (ref 70–99)
Glucose-Capillary: 203 mg/dL — ABNORMAL HIGH (ref 70–99)

## 2021-07-12 SURGERY — ENDARTERECTOMY, CAROTID
Anesthesia: General | Site: Neck | Laterality: Right

## 2021-07-12 MED ORDER — SODIUM CHLORIDE 0.9 % IV SOLN: 500.0000 mL | Freq: Once | INTRAVENOUS | Status: DC | PRN

## 2021-07-12 MED ORDER — ORAL CARE MOUTH RINSE: 15.0000 mL | Freq: Once | OROMUCOSAL | Status: AC

## 2021-07-12 MED ORDER — DUTASTERIDE 0.5 MG PO CAPS
0.5000 mg | ORAL_CAPSULE | Freq: Every day | ORAL | Status: DC
  Filled 2021-07-12: qty 1

## 2021-07-12 MED ORDER — MORPHINE SULFATE (PF) 2 MG/ML IV SOLN: 2.0000 mg | INTRAVENOUS | Status: DC | PRN

## 2021-07-12 MED ORDER — ONDANSETRON HCL 4 MG/2ML IJ SOLN: 4.0000 mg | Freq: Four times a day (QID) | INTRAMUSCULAR | Status: DC | PRN

## 2021-07-12 MED ORDER — ASPIRIN EC 81 MG PO TBEC
81.0000 mg | DELAYED_RELEASE_TABLET | Freq: Every day | ORAL | Status: DC
  Administered 2021-07-12: 81 mg via ORAL
  Filled 2021-07-12 (×2): qty 1

## 2021-07-12 MED ORDER — SODIUM CHLORIDE 0.9 % IV SOLN: INTRAVENOUS | Status: DC

## 2021-07-12 MED ORDER — LABETALOL HCL 5 MG/ML IV SOLN
10.0000 mg | INTRAVENOUS | Status: DC | PRN
  Filled 2021-07-12: qty 4

## 2021-07-12 MED ORDER — ALUM & MAG HYDROXIDE-SIMETH 200-200-20 MG/5ML PO SUSP: 15.0000 mL | ORAL | Status: DC | PRN

## 2021-07-12 MED ORDER — LISINOPRIL 10 MG PO TABS
10.0000 mg | ORAL_TABLET | Freq: Every day | ORAL | Status: DC
  Filled 2021-07-12: qty 1

## 2021-07-12 MED ORDER — PROPOFOL 10 MG/ML IV BOLUS
INTRAVENOUS | Status: DC | PRN
Start: 2021-07-12 — End: 2021-07-12
  Administered 2021-07-12: 50 mg via INTRAVENOUS
  Administered 2021-07-12: 40 mg via INTRAVENOUS

## 2021-07-12 MED ORDER — HEMOSTATIC AGENTS (NO CHARGE) OPTIME
TOPICAL | Status: DC | PRN
Start: 2021-07-12 — End: 2021-07-12
  Administered 2021-07-12: 1 via TOPICAL

## 2021-07-12 MED ORDER — TAMSULOSIN HCL 0.4 MG PO CAPS
0.4000 mg | ORAL_CAPSULE | Freq: Every day | ORAL | Status: DC
  Administered 2021-07-12: 0.4 mg via ORAL
  Filled 2021-07-12 (×2): qty 1

## 2021-07-12 MED ORDER — DEXAMETHASONE SODIUM PHOSPHATE 10 MG/ML IJ SOLN
INTRAMUSCULAR | Status: DC | PRN
  Administered 2021-07-12: 5 mg via INTRAVENOUS

## 2021-07-12 MED ORDER — APREPITANT 40 MG PO CAPS: 40.0000 mg | ORAL_CAPSULE | Freq: Once | ORAL | Status: AC

## 2021-07-12 MED ORDER — INSULIN ASPART 100 UNIT/ML IJ SOLN
INTRAMUSCULAR | Status: AC
  Filled 2021-07-12: qty 1

## 2021-07-12 MED ORDER — CHLORHEXIDINE GLUCONATE 0.12 % MT SOLN: 15.0000 mL | Freq: Once | OROMUCOSAL | Status: AC

## 2021-07-12 MED ORDER — LORAZEPAM 2 MG/ML IJ SOLN: 1.0000 mg | Freq: Once | INTRAMUSCULAR | Status: DC | PRN

## 2021-07-12 MED ORDER — PHENOL 1.4 % MT LIQD
1.0000 | OROMUCOSAL | Status: DC | PRN
  Filled 2021-07-12: qty 177

## 2021-07-12 MED ORDER — POTASSIUM CHLORIDE IN NACL 20-0.9 MEQ/L-% IV SOLN
INTRAVENOUS | Status: AC
  Filled 2021-07-12: qty 1000

## 2021-07-12 MED ORDER — LIDOCAINE HCL (CARDIAC) PF 100 MG/5ML IV SOSY
PREFILLED_SYRINGE | INTRAVENOUS | Status: DC | PRN
  Administered 2021-07-12: 60 mg via INTRAVENOUS

## 2021-07-12 MED ORDER — FENTANYL CITRATE (PF) 100 MCG/2ML IJ SOLN
INTRAMUSCULAR | Status: DC | PRN
Start: 2021-07-12 — End: 2021-07-12
  Administered 2021-07-12 (×2): 25 ug via INTRAVENOUS
  Administered 2021-07-12: 50 ug via INTRAVENOUS

## 2021-07-12 MED ORDER — FENTANYL CITRATE (PF) 100 MCG/2ML IJ SOLN: 25.0000 ug | INTRAMUSCULAR | Status: DC | PRN

## 2021-07-12 MED ORDER — HYDRALAZINE HCL 20 MG/ML IJ SOLN
5.0000 mg | INTRAMUSCULAR | Status: DC | PRN
  Filled 2021-07-12: qty 0.25

## 2021-07-12 MED ORDER — CEFAZOLIN SODIUM-DEXTROSE 2-4 GM/100ML-% IV SOLN: 2.0000 g | Freq: Two times a day (BID) | INTRAVENOUS | Status: AC

## 2021-07-12 MED ORDER — PANTOPRAZOLE SODIUM 40 MG IV SOLR
40.0000 mg | Freq: Every day | INTRAVENOUS | Status: DC
  Administered 2021-07-12: 40 mg via INTRAVENOUS
  Filled 2021-07-12 (×2): qty 40

## 2021-07-12 MED ORDER — FAMOTIDINE 20 MG PO TABS
ORAL_TABLET | ORAL | Status: AC
  Administered 2021-07-12: 20 mg via ORAL
  Filled 2021-07-12: qty 1

## 2021-07-12 MED ORDER — CEFAZOLIN SODIUM-DEXTROSE 2-4 GM/100ML-% IV SOLN
2.0000 g | INTRAVENOUS | Status: AC
  Administered 2021-07-12: 2 g via INTRAVENOUS

## 2021-07-12 MED ORDER — DOCUSATE SODIUM 100 MG PO CAPS: 100.0000 mg | ORAL_CAPSULE | Freq: Every day | ORAL | Status: DC

## 2021-07-12 MED ORDER — PROPOFOL 10 MG/ML IV BOLUS
INTRAVENOUS | Status: AC
  Filled 2021-07-12: qty 20

## 2021-07-12 MED ORDER — ROSUVASTATIN CALCIUM 20 MG PO TABS
20.0000 mg | ORAL_TABLET | Freq: Every day | ORAL | Status: DC
  Filled 2021-07-12: qty 1

## 2021-07-12 MED ORDER — SODIUM CHLORIDE 0.9 % IV SOLN: INTRAVENOUS | Status: DC | PRN

## 2021-07-12 MED ORDER — ROCURONIUM BROMIDE 100 MG/10ML IV SOLN
INTRAVENOUS | Status: DC | PRN
  Administered 2021-07-12: 50 mg via INTRAVENOUS
  Administered 2021-07-12: 20 mg via INTRAVENOUS

## 2021-07-12 MED ORDER — ACETAMINOPHEN 325 MG PO TABS
325.0000 mg | ORAL_TABLET | ORAL | Status: DC | PRN
  Administered 2021-07-12: 650 mg via ORAL

## 2021-07-12 MED ORDER — HYDROMORPHONE HCL 1 MG/ML IJ SOLN: 1.0000 mg | Freq: Once | INTRAMUSCULAR | Status: DC | PRN

## 2021-07-12 MED ORDER — 0.9 % SODIUM CHLORIDE (POUR BTL) OPTIME
TOPICAL | Status: DC | PRN
  Administered 2021-07-12: 500 mL

## 2021-07-12 MED ORDER — ONDANSETRON HCL 4 MG/2ML IJ SOLN
INTRAMUSCULAR | Status: DC | PRN
  Administered 2021-07-12: 4 mg via INTRAVENOUS

## 2021-07-12 MED ORDER — CHLORHEXIDINE GLUCONATE 0.12 % MT SOLN
OROMUCOSAL | Status: AC
  Administered 2021-07-12: 15 mL via OROMUCOSAL
  Filled 2021-07-12: qty 15

## 2021-07-12 MED ORDER — FIBRIN SEALANT 2 ML SINGLE DOSE KIT
PACK | CUTANEOUS | Status: DC | PRN
  Administered 2021-07-12: 2 mL via TOPICAL

## 2021-07-12 MED ORDER — PHENYLEPHRINE HCL (PRESSORS) 10 MG/ML IV SOLN
INTRAVENOUS | Status: AC
  Filled 2021-07-12: qty 1

## 2021-07-12 MED ORDER — GUAIFENESIN-DM 100-10 MG/5ML PO SYRP
15.0000 mL | ORAL_SOLUTION | ORAL | Status: DC | PRN
  Filled 2021-07-12: qty 15

## 2021-07-12 MED ORDER — PHENYLEPHRINE HCL-NACL 20-0.9 MG/250ML-% IV SOLN
INTRAVENOUS | Status: DC | PRN
Start: 2021-07-12 — End: 2021-07-12
  Administered 2021-07-12: 50 ug/min via INTRAVENOUS

## 2021-07-12 MED ORDER — HEPARIN SODIUM (PORCINE) 5000 UNIT/ML IJ SOLN
INTRAMUSCULAR | Status: AC
  Filled 2021-07-12: qty 1

## 2021-07-12 MED ORDER — SODIUM CHLORIDE 0.9 % IV SOLN
INTRAVENOUS | Status: DC | PRN
Start: 2021-07-12 — End: 2021-07-12

## 2021-07-12 MED ORDER — OXYCODONE HCL 5 MG PO TABS
ORAL_TABLET | ORAL | Status: AC
  Filled 2021-07-12: qty 1

## 2021-07-12 MED ORDER — HEPARIN SODIUM (PORCINE) 1000 UNIT/ML IJ SOLN
INTRAMUSCULAR | Status: DC | PRN
  Administered 2021-07-12: 7500 [IU] via INTRAVENOUS

## 2021-07-12 MED ORDER — INSULIN ASPART 100 UNIT/ML IJ SOLN
0.0000 [IU] | Freq: Three times a day (TID) | INTRAMUSCULAR | Status: DC
  Administered 2021-07-12: 5 [IU] via SUBCUTANEOUS
  Administered 2021-07-13: 2 [IU] via SUBCUTANEOUS

## 2021-07-12 MED ORDER — CHLORHEXIDINE GLUCONATE CLOTH 2 % EX PADS
6.0000 | MEDICATED_PAD | Freq: Once | CUTANEOUS | Status: AC
  Administered 2021-07-12: 6 via TOPICAL

## 2021-07-12 MED ORDER — FENTANYL CITRATE (PF) 100 MCG/2ML IJ SOLN
INTRAMUSCULAR | Status: AC
  Filled 2021-07-12: qty 2

## 2021-07-12 MED ORDER — ACETAMINOPHEN 325 MG PO TABS
ORAL_TABLET | ORAL | Status: AC
  Filled 2021-07-12: qty 2

## 2021-07-12 MED ORDER — CEFAZOLIN SODIUM-DEXTROSE 2-4 GM/100ML-% IV SOLN
INTRAVENOUS | Status: AC
  Administered 2021-07-12: 2 g via INTRAVENOUS
  Filled 2021-07-12: qty 100

## 2021-07-12 MED ORDER — METOPROLOL TARTRATE 5 MG/5ML IV SOLN: 2.0000 mg | INTRAVENOUS | Status: DC | PRN

## 2021-07-12 MED ORDER — ONDANSETRON HCL 4 MG/2ML IJ SOLN: 4.0000 mg | Freq: Once | INTRAMUSCULAR | Status: DC | PRN

## 2021-07-12 MED ORDER — APREPITANT 40 MG PO CAPS
ORAL_CAPSULE | ORAL | Status: AC
  Administered 2021-07-12: 40 mg via ORAL
  Filled 2021-07-12: qty 1

## 2021-07-12 MED ORDER — CEFAZOLIN SODIUM-DEXTROSE 2-4 GM/100ML-% IV SOLN
INTRAVENOUS | Status: AC
  Administered 2021-07-13: 2 g via INTRAVENOUS
  Filled 2021-07-12: qty 100

## 2021-07-12 MED ORDER — ACETAMINOPHEN 325 MG RE SUPP
325.0000 mg | RECTAL | Status: DC | PRN
  Filled 2021-07-12: qty 2

## 2021-07-12 MED ORDER — SUGAMMADEX SODIUM 200 MG/2ML IV SOLN
INTRAVENOUS | Status: DC | PRN
  Administered 2021-07-12: 200 mg via INTRAVENOUS

## 2021-07-12 MED ORDER — MEPERIDINE HCL 25 MG/ML IJ SOLN: 6.2500 mg | INTRAMUSCULAR | Status: DC | PRN

## 2021-07-12 MED ORDER — OXYCODONE HCL 5 MG PO TABS
5.0000 mg | ORAL_TABLET | ORAL | Status: DC | PRN
  Administered 2021-07-12: 5 mg via ORAL

## 2021-07-12 MED ORDER — FAMOTIDINE 20 MG PO TABS: 20.0000 mg | ORAL_TABLET | Freq: Once | ORAL | Status: AC

## 2021-07-12 MED ORDER — FENOFIBRATE 160 MG PO TABS
160.0000 mg | ORAL_TABLET | Freq: Every day | ORAL | Status: DC
  Filled 2021-07-12: qty 1

## 2021-07-12 SURGICAL SUPPLY — 71 items
APPLIER CLIP 11 MED OPEN (CLIP)
APPLIER CLIP 9.375 SM OPEN (CLIP)
BAG DECANTER FOR FLEXI CONT (MISCELLANEOUS) ×2 IMPLANT
BLADE SURG 15 STRL LF DISP TIS (BLADE) ×1 IMPLANT
BLADE SURG 15 STRL SS (BLADE) ×1
BLADE SURG SZ11 CARB STEEL (BLADE) ×2 IMPLANT
BOOT SUTURE AID YELLOW STND (SUTURE) ×4 IMPLANT
BRUSH SCRUB EZ  4% CHG (MISCELLANEOUS) ×1
BRUSH SCRUB EZ 4% CHG (MISCELLANEOUS) ×1 IMPLANT
CHLORAPREP W/TINT 26 (MISCELLANEOUS) ×2 IMPLANT
CLIP APPLIE 11 MED OPEN (CLIP) IMPLANT
CLIP APPLIE 9.375 SM OPEN (CLIP) IMPLANT
DERMABOND ADVANCED (GAUZE/BANDAGES/DRESSINGS) ×1
DERMABOND ADVANCED .7 DNX12 (GAUZE/BANDAGES/DRESSINGS) ×1 IMPLANT
DRAPE 3/4 80X56 (DRAPES) ×2 IMPLANT
DRAPE INCISE IOBAN 66X45 STRL (DRAPES) ×2 IMPLANT
DRAPE LAPAROTOMY 100X77 ABD (DRAPES) ×2 IMPLANT
DRESSING SURGICEL FIBRLLR 1X2 (HEMOSTASIS) ×1 IMPLANT
DRSG SURGICEL FIBRILLAR 1X2 (HEMOSTASIS) ×2
ELECT CAUTERY BLADE 6.4 (BLADE) ×2 IMPLANT
ELECT REM PT RETURN 9FT ADLT (ELECTROSURGICAL) ×2
ELECTRODE REM PT RTRN 9FT ADLT (ELECTROSURGICAL) ×1 IMPLANT
GAUZE 4X4 16PLY ~~LOC~~+RFID DBL (SPONGE) ×2 IMPLANT
GLOVE SURG ENC MOIS LTX SZ7 (GLOVE) ×2 IMPLANT
GLOVE SURG SYN 8.0 (GLOVE) ×2 IMPLANT
GLOVE SURG SYN 8.0 PF PI (GLOVE) ×1 IMPLANT
GLOVE SURG UNDER LTX SZ7.5 (GLOVE) ×2 IMPLANT
GOWN STRL REUS W/ TWL LRG LVL3 (GOWN DISPOSABLE) ×1 IMPLANT
GOWN STRL REUS W/ TWL XL LVL3 (GOWN DISPOSABLE) ×2 IMPLANT
GOWN STRL REUS W/TWL LRG LVL3 (GOWN DISPOSABLE) ×1
GOWN STRL REUS W/TWL XL LVL3 (GOWN DISPOSABLE) ×2
HOLDER FOLEY CATH W/STRAP (MISCELLANEOUS) ×2 IMPLANT
IV NS 500ML (IV SOLUTION) ×1
IV NS 500ML BAXH (IV SOLUTION) ×1 IMPLANT
KIT TURNOVER KIT A (KITS) ×2 IMPLANT
LABEL OR SOLS (LABEL) ×2 IMPLANT
LOOP RED MAXI  1X406MM (MISCELLANEOUS) ×2
LOOP VESSEL MAXI 1X406 RED (MISCELLANEOUS) ×2 IMPLANT
LOOP VESSEL MINI 0.8X406 BLUE (MISCELLANEOUS) ×1 IMPLANT
LOOPS BLUE MINI 0.8X406MM (MISCELLANEOUS) ×1
MANIFOLD NEPTUNE II (INSTRUMENTS) ×2 IMPLANT
NDL FILTER BLUNT 18X1 1/2 (NEEDLE) ×1 IMPLANT
NDL HYPO 25X1 1.5 SAFETY (NEEDLE) ×1 IMPLANT
NEEDLE FILTER BLUNT 18X 1/2SAF (NEEDLE) ×1
NEEDLE FILTER BLUNT 18X1 1/2 (NEEDLE) ×1 IMPLANT
NEEDLE HYPO 25X1 1.5 SAFETY (NEEDLE) ×2 IMPLANT
NS IRRIG 500ML POUR BTL (IV SOLUTION) ×2 IMPLANT
PACK BASIN MAJOR ARMC (MISCELLANEOUS) ×2 IMPLANT
PATCH CAROTID ECM VASC 1X10 (Prosthesis & Implant Heart) ×1 IMPLANT
PENCIL ELECTRO HAND CTR (MISCELLANEOUS) IMPLANT
SHUNT CAROTID STR REINF 3.0X4. (MISCELLANEOUS) ×2 IMPLANT
SPONGE T-LAP 18X18 ~~LOC~~+RFID (SPONGE) ×4 IMPLANT
SUT MNCRL+ 5-0 UNDYED PC-3 (SUTURE) ×1 IMPLANT
SUT MONOCRYL 5-0 (SUTURE) ×1
SUT PROLENE 6 0 BV (SUTURE) ×13 IMPLANT
SUT PROLENE 7 0 BV 1 (SUTURE) ×9 IMPLANT
SUT SILK 2 0 (SUTURE) ×1
SUT SILK 2-0 18XBRD TIE 12 (SUTURE) ×1 IMPLANT
SUT SILK 3 0 (SUTURE) ×2
SUT SILK 3-0 18XBRD TIE 12 (SUTURE) ×1 IMPLANT
SUT SILK 4 0 (SUTURE) ×1
SUT SILK 4-0 18XBRD TIE 12 (SUTURE) ×1 IMPLANT
SUT VIC AB 3-0 SH 27 (SUTURE) ×1
SUT VIC AB 3-0 SH 27X BRD (SUTURE) ×1 IMPLANT
SUT VICRYL+ 3-0 36IN CT-1 (SUTURE) ×1 IMPLANT
SYR 10ML LL (SYRINGE) ×2 IMPLANT
SYR 20ML LL LF (SYRINGE) ×2 IMPLANT
SYR 3ML LL SCALE MARK (SYRINGE) ×2 IMPLANT
TRAY FOLEY MTR SLVR 16FR STAT (SET/KITS/TRAYS/PACK) ×2 IMPLANT
TUBING CONNECTING 10 (TUBING) IMPLANT
WATER STERILE IRR 500ML POUR (IV SOLUTION) ×2 IMPLANT

## 2021-07-12 NOTE — Anesthesia Preprocedure Evaluation (Addendum)
Anesthesia Evaluation    Airway Mallampati: II  TM Distance: >3 FB Neck ROM: Full    Dental no notable dental hx. (+) Upper Dentures   Pulmonary shortness of breath,    Pulmonary exam normal breath sounds clear to auscultation       Cardiovascular hypertension, Normal cardiovascular exam Rhythm:Regular Rate:Normal  EKG  NSR  1 DEG AVB  LAD  INCOM RBBB  ECHO 11/22 IMPRESSIONS    1. Left ventricular ejection fraction, by estimation, is 50 to 55%. The  left ventricle has low normal function. Left ventricular endocardial  border not optimally defined to evaluate regional wall motion. Left  ventricular diastolic parameters are  consistent with Grade I diastolic dysfunction (impaired relaxation).  2. Right ventricular systolic function is normal. The right ventricular  size is normal. Tricuspid regurgitation signal is inadequate for assessing  PA pressure.  3. The mitral valve was not well visualized. No evidence of mitral valve  regurgitation. No evidence of mitral stenosis.  4. The aortic valve was not well visualized. Aortic valve regurgitation  is not visualized. Aortic valve sclerosis is present, with no evidence of  aortic valve stenosis.  5. The inferior vena cava is normal in size with greater than 50%  respiratory variability, suggesting right atrial pressure of 3 mmHg.    ASYMPT AAA  3.2 CM   HLD    Neuro/Psych CVA, No Residual Symptoms    GI/Hepatic   Endo/Other  diabetes  Renal/GU Renal InsufficiencyRenal diseaseCR  1.39   GFR  49   BPH      Musculoskeletal  (+) Arthritis ,   Abdominal   Peds  Hematology   Anesthesia Other Findings   Reproductive/Obstetrics                            Anesthesia Physical Anesthesia Plan  ASA: 3  Anesthesia Plan: General ETT   Post-op Pain Management:    Induction: Intravenous  PONV Risk Score and Plan:   Airway Management  Planned: Oral ETT  Additional Equipment:   Intra-op Plan:   Post-operative Plan: Extubation in OR  Informed Consent: I have reviewed the patients History and Physical, chart, labs and discussed the procedure including the risks, benefits and alternatives for the proposed anesthesia with the patient or authorized representative who has indicated his/her understanding and acceptance.     Dental Advisory Given  Plan Discussed with: Anesthesiologist, CRNA and Surgeon  Anesthesia Plan Comments: (Patient consented for risks of anesthesia including but not limited to:  - adverse reactions to medications - damage to eyes, teeth, lips or other oral mucosa - nerve damage due to positioning  - sore throat or hoarseness - Damage to heart, brain, nerves, lungs, other parts of body or loss of life  Patient voiced understanding.)        Anesthesia Quick Evaluation

## 2021-07-12 NOTE — Transfer of Care (Signed)
Immediate Anesthesia Transfer of Care Note  Patient: Liston Thum.  Procedure(s) Performed: ENDARTERECTOMY CAROTID (Right: Neck)  Patient Location: PACU  Anesthesia Type:General  Level of Consciousness: awake, alert  and oriented  Airway & Oxygen Therapy: Patient Spontanous Breathing and Patient connected to face mask oxygen  Post-op Assessment: Report given to RN and Post -op Vital signs reviewed and stable  Post vital signs: Reviewed and stable  Last Vitals:  Vitals Value Taken Time  BP    Temp    Pulse 79 07/12/21 1036  Resp    SpO2 95 % 07/12/21 1036  Vitals shown include unvalidated device data.  Last Pain:  Vitals:   07/12/21 0725  TempSrc: Tympanic  PainSc: 0-No pain         Complications: No notable events documented.

## 2021-07-12 NOTE — Interval H&P Note (Signed)
History and Physical Interval Note:  07/12/2021 8:19 AM  Carlos [name].  has presented today for surgery, with the diagnosis of CAROTID ARTERY STENOSIS.  The various methods of treatment have been discussed with the patient and family. After consideration of risks, benefits and other options for treatment, the patient has consented to  Procedure(s): ENDARTERECTOMY CAROTID (Right) as a surgical intervention.  The patient's history has been reviewed, patient examined, no change in status, stable for surgery.  I have reviewed the patient's chart and labs.  Questions were answered to the patient's satisfaction.     [name]

## 2021-07-12 NOTE — Anesthesia Procedure Notes (Signed)
Procedure Name: Intubation Date/Time: 07/12/2021 9:10 AM Performed by: Nelda Marseille, CRNA Pre-anesthesia Checklist: Patient identified, Patient being monitored, Timeout performed, Emergency Drugs available and Suction available Patient Re-evaluated:Patient Re-evaluated prior to induction Oxygen Delivery Method: Circle system utilized Preoxygenation: Pre-oxygenation with 100% oxygen Induction Type: IV induction Ventilation: Mask ventilation without difficulty Laryngoscope Size: Mac, 3 and McGraph Grade View: Grade I Tube type: Oral Tube size: 7.5 mm Number of attempts: 1 Airway Equipment and Method: Stylet Placement Confirmation: ETT inserted through vocal cords under direct vision, positive ETCO2 and breath sounds checked- equal and bilateral Secured at: 23 cm Tube secured with: Tape Dental Injury: Teeth and Oropharynx as per pre-operative assessment

## 2021-07-12 NOTE — Op Note (Signed)
[place] [facility]   OPERATIVE NOTE  PROCEDURE:   1.  Right carotid endarterectomy with CorMatrix arterial patch reconstruction  PRE-OPERATIVE DIAGNOSIS: 1.  Critical right internal carotid stenosis 2.  History of CVA  POST-OPERATIVE DIAGNOSIS: same as above   SURGEON: Katha Cabal, MD  ASSISTANT(S): Algernon Huxley, MD  ANESTHESIA: general  ESTIMATED BLOOD LOSS: 50 cc  FINDING(S): 1.  Extensive calcified carotid plaque.  SPECIMEN(S):  Carotid plaque (sent to Pathology)  INDICATIONS:   Carlos [name]. is a 88 y.o. y.o. male who presents with symptomatic right carotid stenosis of 90%.  The risks, benefits, and alternatives to carotid endarterectomy were discussed with the patient. The differences between carotid stenting and carotid endarterectomy were reviewed.  The patient voiced understanding and appears to be aware that the risks of carotid endarterectomy include but are not limited to: bleeding, infection, stroke, myocardial infarction, death, cranial nerve injuries both temporary and permanent, neck hematoma, possible airway compromise, labile blood pressure post-operatively, cerebral hyperperfusion syndrome, and possible need for additional interventions in the future. The patient is aware of the risks and agrees to proceed forward with the procedure.  DESCRIPTION: After full informed written consent was obtained from the patient, the patient was brought back to the operating room and placed supine upon the operating table.  Prior to induction, the patient received IV antibiotics.  After obtaining adequate anesthesia, the patient was placed a supine position with a shoulder roll in place and the patient's neck slightly hyperextended and rotated away from the surgical site.  The patient was prepped in the standard fashion for a carotid endarterectomy.    A first assistant was required to provide a safe and appropriate environment for executing the surgery.   The assistant was integral in providing retraction, exposure, running suture providing suction and in the closing process.  The incision was made anterior to the sternocleidomastoid muscle and dissected down through the subcutaneous tissue.  The platysmas was opened with electrocautery.  The internal jugular vein and facial vein were identified.  The facial vein is ligated and divided between 2-0 silk ties.  The omohyoid was identified in the common carotid artery exposed at this level. The dissection was there in carried out along the carotid artery in a cranial direction.  The dissection was then carried along periadventitial plane along the common carotid artery up to the bifurcation. The external carotid artery was identified. Vessel loops were then placed around the external carotid artery as well as the superior thyroid artery. In the process of this dissection, the hypoglossal nerve was identified and protected from harm.  The internal carotid artery was then dissected circumferentially just beyond an area in the internal carotid artery distal to the plaque.    At this point, we gave the patient 7500 units of intravenous heparin.  After this was allowed to circulate for several minutes, the common carotid followed by the external carotid and then the internal carotid artery were clamped.  Arteriotomy was made in the common carotid artery with a 11 blade, and extended the arteriotomy with a Potts scissor down into the common carotid artery, then the arteriotomy was carried through the bifurcation into the internal carotid artery until I reached an area that was not diseased.  At this point, a Sundt shunt was placed.  The endarterectomy was begun in the common carotid artery with a Garment/textile technologist and carried this dissection down into the common carotid artery circumferentially.  Then I transected  the plaque at a segment where it was adherent and transected the plaque with Potts scissors.  I then  carried this dissection up into the external carotid artery.  The plaque was extracted by unclamping the external carotid artery and performing an eversion endarterectomy.  The dissection was then carried into the internal carotid artery where a  feathered end point was created.  The plaque was passed off the field as a specimen.  The distal endpoint was tacked down with 6 interrupted 7-0 Prolene sutures.  A CorMatrix arterial patch was delivered onto the field and trimmed appropriately for the artery and sewed in place with 6-0 Prolene using a 4 quadrant technique.  The medial suture line was completed and the lateral suture line was run approximately one quarter the length of the arteriotomy.  Prior to completing this patch angioplasty, the shunt was removed, the internal carotid artery was flushed and there was excellent backbleeding.  The carotid artery repair was flushed with heparinized saline and then the patch angioplasty was completed in the usual fashion.  The flow was then reestablished first to the external carotid artery and then the internal carotid artery to prevent distal embolization.   Several minutes of pressure were held and 6-0 Prolene patch sutures were used as need for hemostasis.  At this point, I placed Surgicel and Evicel topical hemostatic agents.  There was no more active bleeding in the surgical site.  The sternocleidomastoid space was closed with three interrupted 3-0 Vicryl sutures. I then reapproximated the platysma muscle with a running stitch of 3-0 Vicryl.  The skin was then closed with a running subcuticular 4-0 Monocryl.  The skin was then cleaned, dried and Dermabond was used to reinforce the skin closure.  The patient awakened and was taken to the recovery room in stable condition, following commands and moving all four extremities without any apparent deficits.    COMPLICATIONS: none  CONDITION: stable  [name] 07/12/2021<10:18 AM

## 2021-07-12 NOTE — Progress Notes (Signed)
Dentures returned.

## 2021-07-12 NOTE — Plan of Care (Signed)
Continuing to educate

## 2021-07-12 NOTE — Anesthesia Postprocedure Evaluation (Signed)
Anesthesia Post Note  Patient: Carlos [name].  Procedure(s) Performed: ENDARTERECTOMY CAROTID (Right: Neck)  Anesthesia Type: General Anesthetic complications: no   There were no known notable events for this encounter.   Last Vitals:  Vitals:   07/12/21 1036 07/12/21 1045  BP: 106/65 (!) 110/58  Pulse: 79 75  Resp: 16 20  Temp: 36.6 C   SpO2: 95% 95%    Last Pain:  Vitals:   07/12/21 1036  TempSrc:   PainSc: 0-No pain                 Jerlyn Ly

## 2021-07-13 ENCOUNTER — Encounter: Payer: Self-pay | Admitting: Vascular Surgery

## 2021-07-13 LAB — CBC
HCT: 34.5 % — ABNORMAL LOW (ref 39.0–52.0)
Hemoglobin: 11.8 g/dL — ABNORMAL LOW (ref 13.0–17.0)
MCH: 29.8 pg (ref 26.0–34.0)
MCHC: 34.2 g/dL (ref 30.0–36.0)
MCV: 87.1 fL (ref 80.0–100.0)
Platelets: 214 10*3/uL (ref 150–400)
RBC: 3.96 MIL/uL — ABNORMAL LOW (ref 4.22–5.81)
RDW: 13.7 % (ref 11.5–15.5)
WBC: 10.7 10*3/uL — ABNORMAL HIGH (ref 4.0–10.5)
nRBC: 0 % (ref 0.0–0.2)

## 2021-07-13 LAB — BASIC METABOLIC PANEL
Anion gap: 8 (ref 5–15)
BUN: 28 mg/dL — ABNORMAL HIGH (ref 8–23)
CO2: 23 mmol/L (ref 22–32)
Calcium: 8.5 mg/dL — ABNORMAL LOW (ref 8.9–10.3)
Chloride: 107 mmol/L (ref 98–111)
Creatinine, Ser: 1.31 mg/dL — ABNORMAL HIGH (ref 0.61–1.24)
GFR, Estimated: 52 mL/min — ABNORMAL LOW (ref >60)
Glucose, Bld: 169 mg/dL — ABNORMAL HIGH (ref 70–99)
Potassium: 4.6 mmol/L (ref 3.5–5.1)
Sodium: 138 mmol/L (ref 135–145)

## 2021-07-13 LAB — GLUCOSE, CAPILLARY: Glucose-Capillary: 145 mg/dL — ABNORMAL HIGH (ref 70–99)

## 2021-07-13 LAB — SURGICAL PATHOLOGY

## 2021-07-13 MED ORDER — SODIUM CHLORIDE FLUSH 0.9 % IV SOLN
INTRAVENOUS | Status: AC
  Filled 2021-07-13: qty 10

## 2021-07-13 MED ORDER — CEFAZOLIN SODIUM-DEXTROSE 2-4 GM/100ML-% IV SOLN
INTRAVENOUS | Status: AC
  Filled 2021-07-13: qty 100

## 2021-07-13 MED ORDER — INSULIN ASPART 100 UNIT/ML IJ SOLN
INTRAMUSCULAR | Status: AC
  Filled 2021-07-13: qty 1

## 2021-07-13 NOTE — Discharge Summary (Signed)
[facility]    Discharge Summary    Patient ID:  Carlos [name]. MRN: [id number] DOB/AGE: 86-Aug-1932 86 y.o.  Admit date: 07/12/2021 Discharge date: 07/13/2021 Date of Surgery: 07/12/2021 Surgeon: Surgeon(s): Makalyn Lennox, Latina Craver, MD Annice Needy, MD  Admission Diagnosis: Carotid stenosis, symptomatic w/o infarct, right [I65.21]  Discharge Diagnoses:  Carotid stenosis, symptomatic w/o infarct, right [I65.21]  Secondary Diagnoses: Past Medical History:  Diagnosis Date   AAA (abdominal aortic aneurysm) without rupture 12/02/2019   a.) CT 12/02/2019: measured 3.2 cm   Aortic atherosclerosis (HCC)    BPH (benign prostatic hyperplasia)    Chronic renal insufficiency    Dyspnea    Full dentures    History of kidney stones    Hypertension    Hypertriglyceridemia 01/18/2014   Long term current use of antithrombotics/antiplatelets    a.) DAPT therapy (ASA + clopidogrel)   T2DM (type 2 diabetes mellitus) (HCC)     Procedure(s): ENDARTERECTOMY CAROTID  Discharged Condition: good  HPI:  Patient presented to [facility] on July 12, 2021 for a right carotid endarterectomy.  His surgery was uneventful and on postoperative day 1 he is fit for discharge.  Hospital Course:  Rannie Craney. is a 88 y.o. male is S/P Right Procedure(s): ENDARTERECTOMY CAROTID Extubated: POD # 0 Physical exam: Right neck incision clean dry and intact no evidence of hematoma neuro is unchanged from preop he has minor aphasia Post-op wounds clean, dry, intact or healing well Pt. Ambulating, voiding and taking PO diet without difficulty. Pt pain controlled with PO pain meds. Labs as below Complications:none  Consults:    Significant Diagnostic Studies: CBC Lab Results  Component Value Date   WBC 10.7 (H) 07/13/2021   HGB 11.8 (L) 07/13/2021   HCT 34.5 (L) 07/13/2021   MCV 87.1 07/13/2021   PLT 214 07/13/2021    BMET    Component  Value Date/Time   NA 138 07/13/2021 0327   K 4.6 07/13/2021 0327   CL 107 07/13/2021 0327   CO2 23 07/13/2021 0327   GLUCOSE 169 (H) 07/13/2021 0327   BUN 28 (H) 07/13/2021 0327   CREATININE 1.31 (H) 07/13/2021 0327   CALCIUM 8.5 (L) 07/13/2021 0327   GFRNONAA 52 (L) 07/13/2021 0327   COAG Lab Results  Component Value Date   INR 1.0 05/27/2021     Disposition:  Discharge to :Home Discharge Instructions     Call MD for:  redness, tenderness, or signs of infection (pain, swelling, bleeding, redness, odor or green/yellow discharge around incision site)   Complete by: As directed    Call MD for:  severe or increased pain, loss or decreased feeling  in affected limb(s)   Complete by: As directed    Call MD for:  temperature >100.5   Complete by: As directed    Discharge instructions   Complete by: As directed    Okay to shower please pat the right neck incision dry with a towel rather than rub it   Driving Restrictions   Complete by: As directed    No driving for 1 week if cleared by neurology   Lifting restrictions   Complete by: As directed    No lifting for 1 week   No dressing needed   Complete by: As directed    Replace only if drainage present   Resume previous diet   Complete by: As directed       Allergies as of 07/13/2021  No Known Allergies      Medication List     STOP taking these medications    clopidogrel 75 MG tablet Commonly known as: PLAVIX       TAKE these medications    acetaminophen 500 MG tablet Commonly known as: TYLENOL Take 1,000 mg by mouth daily as needed for moderate pain.   aspirin EC 81 MG tablet Take 1 tablet (81 mg total) by mouth daily. Swallow whole.   dutasteride 0.5 MG capsule Commonly known as: AVODART Take 0.5 mg by mouth daily.   fenofibrate 145 MG tablet Commonly known as: TRICOR Take 145 mg by mouth daily.   glimepiride 4 MG tablet Commonly known as: AMARYL Take 2-4 mg by mouth See admin instructions.  Take 4 mg with breakfast and 2 mg at lunch   lisinopril 20 MG tablet Commonly known as: ZESTRIL Take 10 mg by mouth daily.   multivitamin with minerals Tabs tablet Take 1 tablet by mouth daily.   rosuvastatin 20 MG tablet Commonly known as: CRESTOR Take 20 mg by mouth daily.   sitaGLIPtin 50 MG tablet Commonly known as: JANUVIA Take 50 mg by mouth daily.   tamsulosin 0.4 MG Caps capsule Commonly known as: FLOMAX Take 0.4 mg by mouth at bedtime.       Verbal and written Discharge instructions given to the patient. Wound care per Discharge AVS  Follow-up Information     Loralei Radcliffe, Latina Craver, MD Follow up in 3 week(s).   Specialties: Vascular Surgery, Cardiology, Radiology, Vascular Surgery Why: follow up after procedure with a carotid ultrasound Contact information: [address] [place] [address] [phone]                 Signed: Levora Dredge, MD  07/13/2021, 10:12 AM

## 2021-07-13 NOTE — Progress Notes (Signed)
Nsg Discharge Note  Admit Date:  07/12/2021 Discharge date: 07/13/2021   Carlos [name]. to be D/C'd Home per MD order.  AVS completed.   Patient/caregiver able to verbalize understanding.  Discharge Medication: Allergies as of 07/13/2021   No Known Allergies      Medication List     STOP taking these medications    clopidogrel 75 MG tablet Commonly known as: PLAVIX       TAKE these medications    acetaminophen 500 MG tablet Commonly known as: TYLENOL Take 1,000 mg by mouth daily as needed for moderate pain.   aspirin EC 81 MG tablet Take 1 tablet (81 mg total) by mouth daily. Swallow whole.   dutasteride 0.5 MG capsule Commonly known as: AVODART Take 0.5 mg by mouth daily.   fenofibrate 145 MG tablet Commonly known as: TRICOR Take 145 mg by mouth daily.   glimepiride 4 MG tablet Commonly known as: AMARYL Take 2-4 mg by mouth See admin instructions. Take 4 mg with breakfast and 2 mg at lunch   lisinopril 20 MG tablet Commonly known as: ZESTRIL Take 10 mg by mouth daily.   multivitamin with minerals Tabs tablet Take 1 tablet by mouth daily.   rosuvastatin 20 MG tablet Commonly known as: CRESTOR Take 20 mg by mouth daily.   sitaGLIPtin 50 MG tablet Commonly known as: JANUVIA Take 50 mg by mouth daily.   tamsulosin 0.4 MG Caps capsule Commonly known as: FLOMAX Take 0.4 mg by mouth at bedtime.        Discharge Assessment: Vitals:   07/13/21 0548 07/13/21 0745  BP: 101/63 (!) 110/59  Pulse: 63 63  Resp: 20 15  Temp: (!) 97.1 F (36.2 C) 98.2 F (36.8 C)  SpO2: 98% 97%   Skin clean, dry and intact without evidence of skin break down, no evidence of skin tears noted. IV catheter discontinued intact. Site without signs and symptoms of complications - no redness or edema noted at insertion site, patient denies c/o pain - only slight tenderness at site.  Dressing with slight pressure applied.  D/c Instructions-Education: Discharge instructions  given to patient/family with verbalized understanding. D/c education completed with patient/family including follow up instructions, medication list, d/c activities limitations if indicated, with other d/c instructions as indicated by MD - patient able to verbalize understanding, all questions fully answered. Patient instructed to return to ED, call 911, or call MD for any changes in condition.  Patient escorted via WC, and D/C home via private auto.  Theodore Demark, RN 07/13/2021 10:14 AM

## 2021-07-14 ENCOUNTER — Telehealth (INDEPENDENT_AMBULATORY_CARE_PROVIDER_SITE_OTHER): Payer: Self-pay

## 2021-07-14 LAB — TYPE AND SCREEN
ABO/RH(D): A POS
Antibody Screen: POSITIVE
Unit division: 0
Unit division: 0

## 2021-07-14 LAB — BPAM RBC
Blood Product Expiration Date: 202301302359
Blood Product Expiration Date: 202301302359
Unit Type and Rh: 6200
Unit Type and Rh: 6200

## 2021-07-14 NOTE — Telephone Encounter (Signed)
Patient daughter was made aware with medical advice and verbalized understanding. ?

## 2021-07-14 NOTE — Telephone Encounter (Signed)
Patients daughter called in wanting to know if having low blood pressure is common after procedure. Checked this morning it was 89/69 hr 52 at 8 AM and then at 9 Am it was 101/56 HR 72  Just concerned and wanting to make sure everything is ok.  Please call and advise

## 2021-07-14 NOTE — Telephone Encounter (Signed)
The first BP is a little low but it was first thing this morning.  The second BP is more reassuring.  As long as he is greater than 100 (top number) it is ok.  He also should be sure to keep drinking/eating plenty to make sure he is not dehydrated.

## 2021-07-20 ENCOUNTER — Encounter: Payer: Self-pay | Admitting: Cardiology

## 2021-07-20 ENCOUNTER — Ambulatory Visit: Payer: Medicare PPO | Admitting: Cardiology

## 2021-07-20 ENCOUNTER — Other Ambulatory Visit: Payer: Self-pay

## 2021-07-20 VITALS — BP 116/52 | HR 91 | Ht 62.0 in | Wt 164.0 lb

## 2021-07-20 DIAGNOSIS — G459 Transient cerebral ischemic attack, unspecified: Secondary | ICD-10-CM

## 2021-07-20 DIAGNOSIS — I6521 Occlusion and stenosis of right carotid artery: Secondary | ICD-10-CM

## 2021-07-20 DIAGNOSIS — E781 Pure hyperglyceridemia: Secondary | ICD-10-CM

## 2021-07-20 DIAGNOSIS — I6389 Other cerebral infarction: Secondary | ICD-10-CM

## 2021-07-20 DIAGNOSIS — I491 Atrial premature depolarization: Secondary | ICD-10-CM

## 2021-07-20 DIAGNOSIS — E1122 Type 2 diabetes mellitus with diabetic chronic kidney disease: Secondary | ICD-10-CM

## 2021-07-20 DIAGNOSIS — I700 Atherosclerosis of aorta: Secondary | ICD-10-CM

## 2021-07-20 DIAGNOSIS — I10 Essential (primary) hypertension: Secondary | ICD-10-CM | POA: Diagnosis not present

## 2021-07-20 DIAGNOSIS — I7143 Infrarenal abdominal aortic aneurysm, without rupture: Secondary | ICD-10-CM

## 2021-07-20 DIAGNOSIS — N183 Chronic kidney disease, stage 3 unspecified: Secondary | ICD-10-CM

## 2021-07-20 NOTE — Progress Notes (Signed)
Primary Care Provider: Lynnea Ferrier, MD Cardiologist: None Electrophysiologist: None  Clinic Note: Chief Complaint  Patient presents with   4-6 week follow up     Follow up Zio monitor and right carotid endarterectomy. Patient c/o decreased blood pressure. Medications reviewed by the patient verbally.     ===================================  ASSESSMENT/PLAN   Problem List Items Addressed This Visit       Cardiology Problems   Hypertriglyceridemia (Chronic)    On low-dose statin plus fenofibrate.  Has not been rechecked since increased dose of rosuvastatin at the time of TIA.  Defer monitoring/management to PCP.      Relevant Orders   EKG 12-Lead (Completed)   Premature atrial contractions (Chronic)    Isolated PACs noted with short little bursts of atrial tachycardia/atrial runs.  Not on beta-blocker because of advanced age.  Relatively asymptomatic.  Would not treat.      Essential hypertension (Chronic)    Blood pressure actually is pretty stable on lisinopril.  He has been noticing some low pressures off-and-on.  Would not want to be overly aggressive.      Abdominal aortic aneurysm (AAA) without rupture (Chronic)    Relatively small caliber dilated AAA.  Followed by vascular surgery. Continue blood pressure, lipid and glycemic control.      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  Arrhythmias.  No A-fib.  Only short bursts of atrial tachycardia.  Certainly, a could have paroxysmal A-fib that was not captured by monitor, he is asymptomatic.      Aortic atherosclerosis (HCC) (Chronic)    Known AAA. Lipids appear well controlled other than mildly elevated triglycerides.  LDL is 49 on current dose of rosuvastatin and fenofibrate. . A1c 6.  Managed with PCP.  On glipizide, Januvia.  Consider SGLT2 inhibitor for cardiovascular benefit.        Other   Controlled type 2 diabetes mellitus  with stage 3 chronic kidney disease, without long-term current use of insulin (HCC)   Relevant Medications   glipiZIDE (GLUCOTROL) 5 MG tablet   Other Visit Diagnoses     TIA (transient ischemic attack)       Relevant Orders   EKG 12-Lead (Completed)       ===================================  HPI:    Carlos [name]. is a 88 y.o. male with a PMH notable for DM-2, HTN, HLD, CKD-3B, AAA, DJD with recent diagnosis of TIA/carotid artery disease who presents today for follow-up to discuss results of event monitor.Carlos [name]. was last seen on 06/15/2021 as a post hospital follow-up having had hospitalization at [facility] on 05/27/[facility]21.  He was found to have multifocal right parietal ischemia on MRI.  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 => Echocardiogram was relatively normal.  Neurology recommended 30-day event monitor.  For better quality, we ordered 2x14d monitors.   -> He has remained on aspirin Plavix and was pending carotid endarterectomy. => No active cardiac symptoms, I recommended they proceed with surgery.  Recent Hospitalizations:  Right Carotid Endarterectomy 07/12/2021-discharge next day.  Reviewed  CV studies:    The following studies were reviewed today: (if available, images/films reviewed: From Epic Chart or Care Everywhere) Zio patch monitor: Benign/normal.  No notable arrhythmias or significant bradycardia. ZIO Patch Wear Time: 13 days and 21 hours (2022-12-10T10:21:47-0500 to 2022-12-24T07:45:12-498) Predominantly sinus rhythm with with 1  AVB. Heart  rate range 53-155, average 82 bpm. 3 atrial runs: Fastest 4 beats with a rate of 169 bpm. Longest 10 beats with an average rate 123 bpm. Occasional isolated PACs (1.1%); otherwise rare couplets and triplets. Rare isolated PVCs and minimal couplets/triplets. No bigeminy trigeminy. No sustained arrhythmias. Only atrial runs. No significant  bradycardia.   Interval History:   Carlos [name]. returns today having recovered pretty well from his carotid endarterectomy.  He just has occasional dizziness that it Is with walking.  A little bit orthostatic but mostly seems vertiginous in nature.  Denies any long episodes of rapid heartbeats or irregular heartbeat/palpitations, just rare flutters lasting a few seconds.  No further TIA or amaurosis fugax symptoms.  He is using the incentive spirometer several times a day to help improve his breathing.  No major breathing issues. He is doing laps around the house going up and down stairs without any major issues.  No chest pain or pressure.  Just a little bit of baseline dyspnea which is probably more from deconditioning.  Other than little soreness along the neck where his car adenoidectomy was done, he is not having any major issues. He has noted some decreased blood pressures.  CV Review of Symptoms (Summary) Cardiovascular ROS: positive for - dyspnea on exertion and very rare palpitations.  Mild deconditioning; Mild dizziness and lightheadedness-vertigo but no real orthopnea. negative for - chest pain, edema, irregular heartbeat, orthopnea, paroxysmal nocturnal dyspnea, rapid heart rate, shortness of breath, or syncope/near syncope or TIA/amaurosis fugax  REVIEWED OF SYSTEMS   Review of Systems  Constitutional:  Negative for malaise/fatigue (Just a little exercise intolerance due to age and being relatively sedentary since surgery.) and weight loss.  HENT:  Negative for congestion and nosebleeds.   Respiratory:  Positive for shortness of breath (Only with increased exertion). Negative for cough and wheezing.   Cardiovascular:        Per HPI  Gastrointestinal:  Negative for blood in stool and melena.  Genitourinary:  Negative for hematuria.  Musculoskeletal:  Positive for joint pain. Negative for back pain and falls.  Neurological:  Positive for dizziness. Negative for focal  weakness and weakness.  Psychiatric/Behavioral:  Positive for memory loss. Negative for depression. The patient is not nervous/anxious and does not have insomnia.    I have reviewed and (if needed) personally updated the patient's problem list, medications, allergies, past medical and surgical history, social and family history.   PAST MEDICAL HISTORY   Past Medical History:  Diagnosis Date   AAA (abdominal aortic aneurysm) without rupture 12/02/2019   a.) CT 12/02/2019: measured 3.2 cm   Aortic atherosclerosis (HCC)    BPH (benign prostatic hyperplasia)    Chronic renal insufficiency    Dyspnea    Full dentures    History of kidney stones    Hypertension    Hypertriglyceridemia 01/18/2014   Long term current use of antithrombotics/antiplatelets    (Per vascular surgery) a.) DAPT therapy (ASA + clopidogrel)   T2DM (type 2 diabetes mellitus) ([place])     PAST SURGICAL HISTORY   Past Surgical History:  Procedure Laterality Date   CARPAL TUNNEL RELEASE Right 11/12/2016   Procedure: CARPAL TUNNEL RELEASE;  Surgeon: Leanor Kail, MD;  Location: [facility];  Service: Orthopedics;  Laterality: Right;   CARPAL TUNNEL RELEASE Left 08/09/2017   Procedure: OPEN CARPAL TUNNEL RELEASE;  Surgeon: Leanor Kail, MD;  Location: [place];  Service: Orthopedics;  Laterality: Left;  Diabetic - oral meds  CATARACT EXTRACTION W/ INTRAOCULAR LENS  IMPLANT, BILATERAL     ENDARTERECTOMY Right 07/12/2021   Procedure: ENDARTERECTOMY CAROTID;  Surgeon: Katha Cabal, MD;  Location: [facility];  Service: Vascular;  Laterality: Right;   KNEE ARTHROPLASTY Left 02/01/2021   Procedure: COMPUTER ASSISTED TOTAL KNEE ARTHROPLASTY;  Surgeon: Dereck Leep, MD;  Location: [facility];  Service: Orthopedics;  Laterality: Left;   LITHOTRIPSY  09/2018   at [place]  Right 12/2019   TONSILLECTOMY  1960   TRANSTHORACIC ECHOCARDIOGRAM  05/2021   EF 50 to 55%.  Unable to fully assess wall motion.   GR 1 DD.  Normal RV.  Aortic sclerosis but no stenosis.  Normal mitral valve.  Normal atrial sizes.    Immunization History  Administered Date(s) Administered   PFIZER Comirnaty(Gray Top)Covid-19 Tri-Sucrose Vaccine 08/10/2019, 09/23/2019, 05/31/2020   PFIZER(Purple Top)SARS-COV-2 Vaccination 08/10/2019, 09/23/2019    MEDICATIONS/ALLERGIES   Current Meds  Medication Sig   acetaminophen (TYLENOL) 500 MG tablet Take 1,000 mg by mouth daily as needed for moderate pain.   aspirin EC 81 MG tablet Take 1 tablet (81 mg total) by mouth daily. Swallow whole.   dutasteride (AVODART) 0.5 MG capsule Take 0.5 mg by mouth daily.   fenofibrate (TRICOR) 145 MG tablet Take 145 mg by mouth daily.   glipiZIDE (GLUCOTROL) 5 MG tablet Take 5 mg by mouth every morning.   lisinopril (PRINIVIL,ZESTRIL) 20 MG tablet Take 10 mg by mouth daily.   Multiple Vitamin (MULTIVITAMIN WITH MINERALS) TABS tablet Take 1 tablet by mouth daily.   rosuvastatin (CRESTOR) 20 MG tablet Take 20 mg by mouth daily.   sitaGLIPtin (JANUVIA) 50 MG tablet Take 50 mg by mouth daily.   tamsulosin (FLOMAX) 0.4 MG CAPS capsule Take 0.4 mg by mouth at bedtime.    No Known Allergies  SOCIAL HISTORY/FAMILY HISTORY   Reviewed in Epic:  Pertinent findings:  Social History   Tobacco Use   Smoking status: Never   Smokeless tobacco: Never  Vaping Use   Vaping Use: Never used  Substance Use Topics   Alcohol use: Not Currently    Comment: may have a beer a few times each year   Drug use: Never   Social History   Social History Narrative   Not on file    OBJCTIVE -PE, EKG, labs   Wt Readings from Last 3 Encounters:  08/03/21 163 lb 12.8 oz (74.3 kg)  07/20/21 164 lb (74.4 kg)  07/12/21 165 lb (74.8 kg)    Physical Exam: BP (!) 116/52 (BP Location: Left Arm, Patient Position: Sitting, Cuff Size: Normal)    Pulse 91    Ht 5\' 2"  (1.575 m)    Wt 164 lb (74.4 kg)    SpO2 98%    BMI 30.00 kg/m  Physical Exam Constitutional:       General: He is not in acute distress.    Appearance: Normal appearance. He is obese. He is not ill-appearing.  HENT:     Head: Normocephalic and atraumatic.  Pulmonary:     Effort: Pulmonary effort is normal.  Musculoskeletal:        General: No swelling.  Neurological:     General: No focal deficit present.     Mental Status: He is alert and oriented to person, place, and time.  Psychiatric:        Mood and Affect: Mood normal.        Behavior: Behavior normal.  Thought Content: Thought content normal.        Judgment: Judgment normal.     Adult ECG Report Not checked  Recent Labs: Reviewed Lab Results  Component Value Date   CHOL 107 05/28/2021   HDL 19 (L) 05/28/2021   LDLCALC 49 05/28/2021   TRIG 196 (H) 05/28/2021   CHOLHDL 5.6 05/28/2021   Lab Results  Component Value Date   CREATININE 1.31 (H) 07/13/2021   BUN 28 (H) 07/13/2021   NA 138 07/13/2021   K 4.6 07/13/2021   CL 107 07/13/2021   CO2 23 07/13/2021   CBC Latest Ref Rng & Units 07/13/2021 07/07/2021 05/28/2021  WBC 4.0 - 10.5 K/uL 10.7(H) 5.8 5.5  Hemoglobin 13.0 - 17.0 g/dL 11.8(L) 13.3 12.4(L)  Hematocrit 39.0 - 52.0 % 34.5(L) 40.9 37.8(L)  Platelets 150 - 400 K/uL 214 237 261    Lab Results  Component Value Date   HGBA1C 6.9 (H) 05/28/2021   No results found for: TSH  ==================================================  COVID-19 Education: The signs and symptoms of COVID-19 were discussed with the patient and how to seek care for testing (follow up with PCP or arrange E-visit).    I spent a total of 17 minutes with the patient spent in direct patient consultation.  Additional time spent with chart review  / charting (studies, outside notes, etc): 14 min Total Time: 31 min  Current medicines are reviewed at length with the patient today.  (+/- concerns) N/A  This visit occurred during the SARS-CoV-2 public health emergency.  Safety protocols were in place, including screening  questions prior to the visit, additional usage of staff PPE, and extensive cleaning of exam room while observing appropriate contact time as indicated for disinfecting solutions.  Notice: This dictation was prepared with Dragon dictation along with smart phrase technology. Any transcriptional errors that result from this process are unintentional and may not be corrected upon review.  Studies Ordered:  Orders Placed This Encounter  Procedures   EKG 12-Lead    Patient Instructions / Medication Changes & Studies & Tests Ordered   Patient Instructions  Medication Instructions:  Your physician has recommended you make the following change in your medication:   Begin taking your lisinopril at lunch time  *If you need a refill on your cardiac medications before your next appointment, please call your pharmacy*   Lab Work: None ordered  If you have labs (blood work) drawn today and your tests are completely normal, you will receive your results only by: [place] (if you have MyChart) OR A paper copy in the mail If you have any lab test that is abnormal or we need to change your treatment, we will call you to review the results.   Testing/Procedures: None ordered   Follow-Up: At [facility], you and your health needs are our priority.  As part of our continuing mission to provide you with exceptional heart care, we have created designated Provider Care Teams.  These Care Teams include your primary Cardiologist (physician) and Advanced Practice Providers (APPs -  Physician Assistants and Nurse Practitioners) who all work together to provide you with the care you need, when you need it.  We recommend signing up for the patient portal called "MyChart".  Sign up information is provided on this After Visit Summary.  MyChart is used to connect with patients for Virtual Visits (Telemedicine).  Patients are able to view lab/test results, encounter notes, upcoming appointments, etc.   Non-urgent messages can be sent to  your provider as well.   To learn more about what you can do with MyChart, go to [email or web address].    Your next appointment:   As needed  The format for your next appointment:   In Person  Provider:   You may see Glenetta Hew, MD or one of the following Advanced Practice Providers on your designated Care Team:   Murray Hodgkins, NP Christell Faith, PA-C Cadence Kathlen Mody, PA-C:1}    Other Instructions N/A      Glenetta Hew, M.D., M.S. Interventional Cardiologist   Pager # [phone] Phone # [phone] [address] [place], [place] [address]   Thank you for choosing [facility] [place]!!

## 2021-07-20 NOTE — Patient Instructions (Signed)
Medication Instructions:  Your physician has recommended you make the following change in your medication:   Begin taking your lisinopril at lunch time  *If you need a refill on your cardiac medications before your next appointment, please call your pharmacy*   Lab Work: None ordered  If you have labs (blood work) drawn today and your tests are completely normal, you will receive your results only by: MyChart Message (if you have MyChart) OR A paper copy in the mail If you have any lab test that is abnormal or we need to change your treatment, we will call you to review the results.   Testing/Procedures: None ordered   Follow-Up: At [facility], you and your health needs are our priority.  As part of our continuing mission to provide you with exceptional heart care, we have created designated Provider Care Teams.  These Care Teams include your primary Cardiologist (physician) and Advanced Practice Providers (APPs -  Physician Assistants and Nurse Practitioners) who all work together to provide you with the care you need, when you need it.  We recommend signing up for the patient portal called "MyChart".  Sign up information is provided on this After Visit Summary.  MyChart is used to connect with patients for Virtual Visits (Telemedicine).  Patients are able to view lab/test results, encounter notes, upcoming appointments, etc.  Non-urgent messages can be sent to your provider as well.   To learn more about what you can do with MyChart, go to [email or web address].    Your next appointment:   As needed  The format for your next appointment:   In Person  Provider:   You may see Bryan Lemma, MD or one of the following Advanced Practice Providers on your designated Care Team:   Nicolasa Ducking, NP Eula Listen, PA-C Cadence Fransico Michael, PA-C:1}    Other Instructions N/A

## 2021-07-31 NOTE — Progress Notes (Signed)
Patient ID: [name]., male   DOB: [birth year], 88 y.o.   MRN: [id number]  Chief Complaint  Patient presents with   Routine Post Op    [facility] 3 week follow up    HPI Kiko Holste. is a 88 y.o. male.    Procedure 07/12/2021: Right carotid endarterectomy with CorMatrix arterial patch reconstruction  Today the patient continues to have some swelling however he has a well-healed wound.  The wound is clean dry and intact.    Past Medical History:  Diagnosis Date   AAA (abdominal aortic aneurysm) without rupture 12/02/2019   a.) CT 12/02/2019: measured 3.2 cm   Aortic atherosclerosis (HCC)    BPH (benign prostatic hyperplasia)    Chronic renal insufficiency    Dyspnea    Full dentures    History of kidney stones    Hypertension    Hypertriglyceridemia 01/18/2014   Long term current use of antithrombotics/antiplatelets    a.) DAPT therapy (ASA + clopidogrel)   T2DM (type 2 diabetes mellitus) (HCC)     Past Surgical History:  Procedure Laterality Date   CARPAL TUNNEL RELEASE Right 11/12/2016   Procedure: CARPAL TUNNEL RELEASE;  Surgeon: Erin Sons, MD;  Location: [facility];  Service: Orthopedics;  Laterality: Right;   CARPAL TUNNEL RELEASE Left 08/09/2017   Procedure: OPEN CARPAL TUNNEL RELEASE;  Surgeon: Erin Sons, MD;  Location: [facility];  Service: Orthopedics;  Laterality: Left;  Diabetic - oral meds   CATARACT EXTRACTION W/ INTRAOCULAR LENS  IMPLANT, BILATERAL     ENDARTERECTOMY Right 07/12/2021   Procedure: ENDARTERECTOMY CAROTID;  Surgeon: Renford Dills, MD;  Location: [facility];  Service: Vascular;  Laterality: Right;   KNEE ARTHROPLASTY Left 02/01/2021   Procedure: COMPUTER ASSISTED TOTAL KNEE ARTHROPLASTY;  Surgeon: Donato Heinz, MD;  Location: [facility];  Service: Orthopedics;  Laterality: Left;   LITHOTRIPSY  09/2018   at [facility]   testical  Right 12/2019   TONSILLECTOMY  1960      No Known Allergies  Current Outpatient  Medications  Medication Sig Dispense Refill   acetaminophen (TYLENOL) 500 MG tablet Take 1,000 mg by mouth daily as needed for moderate pain.     aspirin EC 81 MG tablet Take 1 tablet (81 mg total) by mouth daily. Swallow whole. 30 tablet 11   dutasteride (AVODART) 0.5 MG capsule Take 0.5 mg by mouth daily.     fenofibrate (TRICOR) 145 MG tablet Take 145 mg by mouth daily.     glipiZIDE (GLUCOTROL) 5 MG tablet Take 5 mg by mouth every morning.     lisinopril (PRINIVIL,ZESTRIL) 20 MG tablet Take 10 mg by mouth daily.     Multiple Vitamin (MULTIVITAMIN WITH MINERALS) TABS tablet Take 1 tablet by mouth daily.     rosuvastatin (CRESTOR) 20 MG tablet Take 20 mg by mouth daily.     sitaGLIPtin (JANUVIA) 50 MG tablet Take 50 mg by mouth daily.     tamsulosin (FLOMAX) 0.4 MG CAPS capsule Take 0.4 mg by mouth at bedtime.     glimepiride (AMARYL) 4 MG tablet Take 2-4 mg by mouth See admin instructions. Take 4 mg with breakfast and 2 mg at lunch (Patient not taking: Reported on 07/20/2021)     No current facility-administered medications for this visit.        Physical Exam BP (!) 106/56 (BP Location: Left Arm)    Pulse 96    Resp 16    Wt  163 lb 12.8 oz (74.3 kg)    BMI 29.96 kg/m  Gen:  WD/WN, NAD Skin: incision C/D/I     Assessment/Plan:  1. Carotid stenosis, symptomatic w/o infarct, right Minimal wall thickening seen following right ICA.  1 to 39% stenosis of the left ICA.  Wound is healing well.  Patient is doing well overall.  We will have her return in 3 months with noninvasive studies.  2. Infrarenal abdominal aortic aneurysm (AAA) without rupture  Patient also has a history of an asymptomatic abdominal aortic aneurysm.  This was identified in Dec 02, 2019 by a CT abdomen and pelvis without contrast.  Aneurysm measures 3.2 cm at that time.  We will plan on having patient undergo noninvasive studies at his 43-month follow-up.       Kris Hartmann 08/06/2021, 2:00 AM   This  note was created with Dragon medical transcription system.  Any errors from dictation are unintentional.

## 2021-08-02 ENCOUNTER — Other Ambulatory Visit (INDEPENDENT_AMBULATORY_CARE_PROVIDER_SITE_OTHER): Payer: Self-pay | Admitting: Vascular Surgery

## 2021-08-02 DIAGNOSIS — Z9889 Other specified postprocedural states: Secondary | ICD-10-CM

## 2021-08-02 DIAGNOSIS — I6521 Occlusion and stenosis of right carotid artery: Secondary | ICD-10-CM

## 2021-08-03 ENCOUNTER — Encounter (INDEPENDENT_AMBULATORY_CARE_PROVIDER_SITE_OTHER): Payer: Self-pay | Admitting: Vascular Surgery

## 2021-08-03 ENCOUNTER — Ambulatory Visit (INDEPENDENT_AMBULATORY_CARE_PROVIDER_SITE_OTHER): Payer: Medicare PPO | Admitting: Nurse Practitioner

## 2021-08-03 ENCOUNTER — Other Ambulatory Visit: Payer: Self-pay

## 2021-08-03 ENCOUNTER — Ambulatory Visit (INDEPENDENT_AMBULATORY_CARE_PROVIDER_SITE_OTHER): Payer: Medicare PPO

## 2021-08-03 VITALS — BP 106/56 | HR 96 | Resp 16 | Wt 163.8 lb

## 2021-08-03 DIAGNOSIS — I6521 Occlusion and stenosis of right carotid artery: Secondary | ICD-10-CM

## 2021-08-03 DIAGNOSIS — Z9889 Other specified postprocedural states: Secondary | ICD-10-CM

## 2021-08-03 DIAGNOSIS — I7143 Infrarenal abdominal aortic aneurysm, without rupture: Secondary | ICD-10-CM

## 2021-08-06 ENCOUNTER — Encounter (INDEPENDENT_AMBULATORY_CARE_PROVIDER_SITE_OTHER): Payer: Self-pay | Admitting: Nurse Practitioner

## 2021-08-29 ENCOUNTER — Encounter: Payer: Self-pay | Admitting: Cardiology

## 2021-08-29 DIAGNOSIS — I491 Atrial premature depolarization: Secondary | ICD-10-CM | POA: Insufficient documentation

## 2021-08-29 NOTE — Assessment & Plan Note (Signed)
Per neurology recommendations, event monitor worn.  Arrhythmias.  No A-fib.  Only short bursts of atrial tachycardia.  Certainly, a could have paroxysmal A-fib that was not captured by monitor, he is asymptomatic.

## 2021-08-29 NOTE — Assessment & Plan Note (Signed)
Known AAA. Lipids appear well controlled other than mildly elevated triglycerides.  LDL is 49 on current dose of rosuvastatin and fenofibrate. . A1c 6.  Managed with PCP.  On glipizide, Januvia.  Consider SGLT2 inhibitor for cardiovascular benefit.

## 2021-08-29 NOTE — Assessment & Plan Note (Signed)
Isolated PACs noted with short little bursts of atrial tachycardia/atrial runs.  Not on beta-blocker because of advanced age.  Relatively asymptomatic.  Would not treat.

## 2021-08-29 NOTE — Assessment & Plan Note (Signed)
Blood pressure actually is pretty stable on lisinopril.  He has been noticing some low pressures off-and-on.  Would not want to be overly aggressive.

## 2021-08-29 NOTE — Assessment & Plan Note (Signed)
On low-dose statin plus fenofibrate.  Has not been rechecked since increased dose of rosuvastatin at the time of TIA.  Defer monitoring/management to PCP.

## 2021-08-29 NOTE — Assessment & Plan Note (Signed)
Relatively small caliber dilated AAA.  Followed by vascular surgery. Continue blood pressure, lipid and glycemic control.

## 2021-10-30 ENCOUNTER — Other Ambulatory Visit (INDEPENDENT_AMBULATORY_CARE_PROVIDER_SITE_OTHER): Payer: Self-pay | Admitting: Nurse Practitioner

## 2021-10-30 DIAGNOSIS — I6523 Occlusion and stenosis of bilateral carotid arteries: Secondary | ICD-10-CM

## 2021-11-01 ENCOUNTER — Encounter (INDEPENDENT_AMBULATORY_CARE_PROVIDER_SITE_OTHER): Payer: Self-pay | Admitting: Nurse Practitioner

## 2021-11-01 ENCOUNTER — Ambulatory Visit (INDEPENDENT_AMBULATORY_CARE_PROVIDER_SITE_OTHER): Payer: Medicare PPO

## 2021-11-01 ENCOUNTER — Ambulatory Visit (INDEPENDENT_AMBULATORY_CARE_PROVIDER_SITE_OTHER): Payer: Medicare PPO | Admitting: Nurse Practitioner

## 2021-11-01 VITALS — BP 87/46 | HR 98 | Resp 15 | Wt 165.4 lb

## 2021-11-01 DIAGNOSIS — E1122 Type 2 diabetes mellitus with diabetic chronic kidney disease: Secondary | ICD-10-CM | POA: Diagnosis not present

## 2021-11-01 DIAGNOSIS — E781 Pure hyperglyceridemia: Secondary | ICD-10-CM | POA: Diagnosis not present

## 2021-11-01 DIAGNOSIS — N183 Chronic kidney disease, stage 3 unspecified: Secondary | ICD-10-CM

## 2021-11-01 DIAGNOSIS — I6521 Occlusion and stenosis of right carotid artery: Secondary | ICD-10-CM

## 2021-11-01 DIAGNOSIS — I6523 Occlusion and stenosis of bilateral carotid arteries: Secondary | ICD-10-CM

## 2021-11-01 DIAGNOSIS — I7143 Infrarenal abdominal aortic aneurysm, without rupture: Secondary | ICD-10-CM | POA: Diagnosis not present

## 2021-11-04 ENCOUNTER — Encounter (INDEPENDENT_AMBULATORY_CARE_PROVIDER_SITE_OTHER): Payer: Self-pay | Admitting: Nurse Practitioner

## 2021-11-04 NOTE — Progress Notes (Signed)
? ?Subjective:  ? ? Patient ID: Carlos [name]., male    DOB: 04/16/1931, 88 y.o.   MRN: [id number] ?Chief Complaint  ?Patient presents with  ? Follow-up  ?  Ultrasound follow up  ? ? ?The patient is seen for follow up evaluation of carotid stenosis. The carotid stenosis followed by ultrasound.  The patient underwent a right carotid endarterectomy on 07/12/2021. ? ?The patient denies amaurosis fugax. There is no recent history of TIA symptoms or focal motor deficits. There is no prior documented CVA. ? ?The patient is taking enteric-coated aspirin 81 mg daily. ? ?There is no history of migraine headaches. There is no history of seizures. ? ?No recent shortening of the patient's walking distance or new symptoms consistent with claudication.  No history of rest pain symptoms. No new ulcers or wounds of the lower extremities have occurred. ? ?There is no history of DVT, PE or superficial thrombophlebitis. ?No recent episodes of angina or shortness of breath documented.  ? ?Carotid Duplex done today shows 1 to 39% stenosis bilaterally.  Less than 50% plaque noted in the common carotid artery bilaterally.  There is not known occlusion of the right vertebral artery but it is reconstituted with collateral. ? ? ?Review of Systems  ?All other systems reviewed and are negative. ? ?   ?Objective:  ? Physical Exam ?Vitals reviewed.  ?HENT:  ?   Head: Normocephalic.  ?Cardiovascular:  ?   Rate and Rhythm: Normal rate.  ?   Pulses: Normal pulses.  ?Pulmonary:  ?   Effort: Pulmonary effort is normal.  ?Skin: ?   General: Skin is warm and dry.  ?Neurological:  ?   Mental Status: He is alert and oriented to person, place, and time.  ?Psychiatric:     ?   Mood and Affect: Mood normal.     ?   Behavior: Behavior normal.     ?   Thought Content: Thought content normal.     ?   Judgment: Judgment normal.  ? ? ?BP (!) 87/46 (BP Location: Left Arm)   Pulse 98   Resp 15   Wt 165 lb 6.4 oz (75 kg)   BMI 30.25 kg/m?  ? ?Past Medical  History:  ?Diagnosis Date  ? AAA (abdominal aortic aneurysm) without rupture ([place]) 12/02/2019  ? a.) CT 12/02/2019: measured 3.2 cm  ? Aortic atherosclerosis ([place])   ? BPH (benign prostatic hyperplasia)   ? Chronic renal insufficiency   ? Dyspnea   ? Full dentures   ? History of kidney stones   ? Hypertension   ? Hypertriglyceridemia 01/18/2014  ? Long term current use of antithrombotics/antiplatelets   ? (Per vascular surgery) a.) DAPT therapy (ASA + clopidogrel)  ? T2DM (type 2 diabetes mellitus) ([place])   ? ? ?Social History  ? ?Socioeconomic History  ? Marital status: Widowed  ?  Spouse name: Not on file  ? Number of children: Not on file  ? Years of education: Not on file  ? Highest education level: Not on file  ?Occupational History  ? Not on file  ?Tobacco Use  ? Smoking status: Never  ? Smokeless tobacco: Never  ?Vaping Use  ? Vaping Use: Never used  ?Substance and Sexual Activity  ? Alcohol use: Not Currently  ?  Comment: may have a beer a few times each year  ? Drug use: Never  ? Sexual activity: Not Currently  ?Other Topics Concern  ? Not on file  ?Social  History Narrative  ? Not on file  ? ?Social Determinants of Health  ? ?Financial Resource Strain: Not on file  ?Food Insecurity: Not on file  ?Transportation Needs: Not on file  ?Physical Activity: Not on file  ?Stress: Not on file  ?Social Connections: Not on file  ?Intimate Partner Violence: Not on file  ? ? ?Past Surgical History:  ?Procedure Laterality Date  ? CARPAL TUNNEL RELEASE Right 11/12/2016  ? Procedure: CARPAL TUNNEL RELEASE;  Surgeon: Leanor Kail, MD;  Location: [facility];  Service: Orthopedics;  Laterality: Right;  ? CARPAL TUNNEL RELEASE Left 08/09/2017  ? Procedure: OPEN CARPAL TUNNEL RELEASE;  Surgeon: Leanor Kail, MD;  Location: [place];  Service: Orthopedics;  Laterality: Left;  Diabetic - oral meds  ? CATARACT EXTRACTION W/ INTRAOCULAR LENS  IMPLANT, BILATERAL    ? ENDARTERECTOMY Right 07/12/2021  ? Procedure:  ENDARTERECTOMY CAROTID;  Surgeon: Katha Cabal, MD;  Location: [facility];  Service: Vascular;  Laterality: Right;  ? KNEE ARTHROPLASTY Left 02/01/2021  ? Procedure: COMPUTER ASSISTED TOTAL KNEE ARTHROPLASTY;  Surgeon: Dereck Leep, MD;  Location: [facility];  Service: Orthopedics;  Laterality: Left;  ? LITHOTRIPSY  09/2018  ? at [facility]  ? testical  Right 12/2019  ? TONSILLECTOMY  1960  ? TRANSTHORACIC ECHOCARDIOGRAM  05/2021  ? EF 50 to 55%.  Unable to fully assess wall motion.  GR 1 DD.  Normal RV.  Aortic sclerosis but no stenosis.  Normal mitral valve.  Normal atrial sizes.  ? ? ?Family History  ?Problem Relation Age of Onset  ? Coronary artery disease Father   ? Colon cancer Father   ? Colon cancer Brother   ? ? ?No Known Allergies ? ? ?  Latest Ref Rng & Units 07/13/2021  ?  3:27 AM 07/07/2021  ?  9:37 AM 05/28/2021  ?  3:08 AM  ?CBC  ?WBC 4.0 - 10.5 K/uL 10.7   5.8   5.5    ?Hemoglobin 13.0 - 17.0 g/dL 11.8   13.3   12.4    ?Hematocrit 39.0 - 52.0 % 34.5   40.9   37.8    ?Platelets 150 - 400 K/uL 214   237   261    ? ? ? ? ?CMP  ?   ?Component Value Date/Time  ? NA 138 07/13/2021 0327  ? K 4.6 07/13/2021 0327  ? CL 107 07/13/2021 0327  ? CO2 23 07/13/2021 0327  ? GLUCOSE 169 (H) 07/13/2021 0327  ? BUN 28 (H) 07/13/2021 0327  ? CREATININE 1.31 (H) 07/13/2021 0327  ? CALCIUM 8.5 (L) 07/13/2021 0327  ? PROT 7.2 05/27/2021 1455  ? ALBUMIN 4.2 05/27/2021 1455  ? AST 36 05/27/2021 1455  ? ALT 20 05/27/2021 1455  ? ALKPHOS 35 (L) 05/27/2021 1455  ? BILITOT 0.7 05/27/2021 1455  ? GFRNONAA 52 (L) 07/13/2021 0327  ? ? ? ?No results found. ? ?   ?Assessment & Plan:  ? ?1. Carotid stenosis, symptomatic w/o infarct, right ?Recommend: ? ?Given the patient's asymptomatic subcritical stenosis no further invasive testing or surgery at this time. ? ?Duplex ultrasound shows 1-39% stenosis bilaterally. ? ?Continue antiplatelet therapy as prescribed ?Continue management of CAD, HTN and Hyperlipidemia ?Healthy heart diet,   encouraged exercise at least 4 times per week ?Follow up in 6 months with duplex ultrasound and physical exam   ? ?2. Hypertriglyceridemia ?Continue statin as ordered and reviewed, no changes at this time  ? ?3. Controlled type 2 diabetes  mellitus with stage 3 chronic kidney disease, without long-term current use of insulin ([place]) ?Continue hypoglycemic medications as already ordered, these medications have been reviewed and there are no changes at this time. ? ?Hgb A1C to be monitored as already arranged by primary service  ? ?4. Infrarenal abdominal aortic aneurysm (AAA) without rupture ([place]) ?Patient has a 3.2 cm abdominal aortic aneurysm measured in 2021 by CT scan.  We will add an abdominal aortic aneurysm duplex to her 21-month follow-up studies. ? ? ?Current Outpatient Medications on File Prior to Visit  ?Medication Sig Dispense Refill  ? acetaminophen (TYLENOL) 500 MG tablet Take 1,000 mg by mouth daily as needed for moderate pain.    ? aspirin EC 81 MG tablet Take 1 tablet (81 mg total) by mouth daily. Swallow whole. 30 tablet 11  ? Cholecalciferol 25 MCG (1000 UT) tablet Take by mouth.    ? cyanocobalamin 1000 MCG tablet Take by mouth.    ? dutasteride (AVODART) 0.5 MG capsule Take 0.5 mg by mouth daily.    ? FARXIGA 5 MG TABS tablet Take by mouth.    ? fenofibrate (TRICOR) 145 MG tablet Take 145 mg by mouth daily.    ? glipiZIDE (GLUCOTROL) 5 MG tablet Take 5 mg by mouth every morning.    ? lisinopril (PRINIVIL,ZESTRIL) 20 MG tablet Take 10 mg by mouth daily.    ? Multiple Vitamin (MULTIVITAMIN WITH MINERALS) TABS tablet Take 1 tablet by mouth daily.    ? rosuvastatin (CRESTOR) 20 MG tablet Take 20 mg by mouth daily.    ? sitaGLIPtin (JANUVIA) 50 MG tablet Take 50 mg by mouth daily.    ? tamsulosin (FLOMAX) 0.4 MG CAPS capsule Take 0.4 mg by mouth at bedtime.    ? glimepiride (AMARYL) 4 MG tablet Take 2-4 mg by mouth See admin instructions. Take 4 mg with breakfast and 2 mg at lunch (Patient not taking:  Reported on 07/20/2021)    ? ?No current facility-administered medications on file prior to visit.  ? ? ?There are no Patient Instructions on file for this visit. ?No follow-ups on file. ? ? ?Kris Hartmann, NP ? ? ?

## 2022-05-01 ENCOUNTER — Other Ambulatory Visit (INDEPENDENT_AMBULATORY_CARE_PROVIDER_SITE_OTHER): Payer: Self-pay | Admitting: Nurse Practitioner

## 2022-05-01 DIAGNOSIS — I6523 Occlusion and stenosis of bilateral carotid arteries: Secondary | ICD-10-CM

## 2022-05-01 NOTE — Progress Notes (Unsigned)
MRN : [id number]  Carlos [name]. is a 88 y.o. (01-Sep-1930) male who presents with chief complaint of check carotid arteries.  History of Present Illness:   The patient is seen for follow up evaluation of carotid stenosis. The carotid stenosis followed by ultrasound.  The patient underwent a right carotid endarterectomy on 07/12/2021.   The patient denies amaurosis fugax. There is no recent history of TIA symptoms or focal motor deficits. There is no prior documented CVA.   The patient is taking enteric-coated aspirin 81 mg daily.   There is no history of migraine headaches. There is no history of seizures.  The patient is also here for surveillance of a known abdominal aortic aneurysm.  Aneurysm was incidentally found Dec 02, 2019 on a scan for a stone.  By report the infrarenal abdominal aortic aneurysm measuring up to 3.2 cm.  Patient denies abdominal pain or back pain, no other abdominal complaints. No changes suggesting embolic episodes.   There have been no interval changes in the patient's overall health care since his last visit.  There is no history of claudication but no rest pain symptoms of the lower extremities. The patient denies angina or shortness of breath.   Duplex US of the aorta and iliac arteries shows an AAA measured *** cm.    Carotid Duplex done today shows 1 to 39% stenosis bilaterally.  Less than 50% plaque noted in the common carotid artery bilaterally.  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  No outpatient medications have been marked as taking for the 05/03/22 encounter (Appointment) with Delana Meyer, Dolores Lory, MD.    Past Medical History:  Diagnosis Date   AAA (abdominal aortic aneurysm) without rupture ([place]) 12/02/2019   a.) CT 12/02/2019: measured 3.2 cm   Aortic atherosclerosis ([facility])    BPH (benign prostatic hyperplasia)    Chronic renal insufficiency    Dyspnea    Full dentures     History of kidney stones    Hypertension    Hypertriglyceridemia 01/18/2014   Long term current use of antithrombotics/antiplatelets    (Per vascular surgery) a.) DAPT therapy (ASA + clopidogrel)   T2DM (type 2 diabetes mellitus) ([place])     Past Surgical History:  Procedure Laterality Date   CARPAL TUNNEL RELEASE Right 11/12/2016   Procedure: CARPAL TUNNEL RELEASE;  Surgeon: Leanor Kail, MD;  Location: [facility];  Service: Orthopedics;  Laterality: Right;   CARPAL TUNNEL RELEASE Left 08/09/2017   Procedure: OPEN CARPAL TUNNEL RELEASE;  Surgeon: Leanor Kail, MD;  Location: [place];  Service: Orthopedics;  Laterality: Left;  Diabetic - oral meds   CATARACT EXTRACTION W/ INTRAOCULAR LENS  IMPLANT, BILATERAL     ENDARTERECTOMY Right 07/12/2021   Procedure: ENDARTERECTOMY CAROTID;  Surgeon: Katha Cabal, MD;  Location: [facility];  Service: Vascular;  Laterality: Right;   KNEE ARTHROPLASTY Left 02/01/2021   Procedure: COMPUTER ASSISTED TOTAL KNEE ARTHROPLASTY;  Surgeon: Dereck Leep, MD;  Location: [facility];  Service: Orthopedics;  Laterality: Left;   LITHOTRIPSY  09/2018   at [place]  Right 12/2019   TONSILLECTOMY  1960   TRANSTHORACIC ECHOCARDIOGRAM  05/2021   EF 50 to 55%.  Unable to fully assess wall motion.  GR 1 DD.  Normal RV.  Aortic sclerosis but no stenosis.  Normal mitral valve.  Normal atrial sizes.    Social History Social History   Tobacco Use  Smoking status: Never   Smokeless tobacco: Never  Vaping Use   Vaping Use: Never used  Substance Use Topics   Alcohol use: Not Currently    Comment: may have a beer a few times each year   Drug use: Never    Family History Family History  Problem Relation Age of Onset   Coronary artery disease Father    Colon cancer Father    Colon cancer Brother     No Known Allergies   REVIEW OF SYSTEMS (Negative unless checked)  Constitutional: [] Weight loss  [] Fever  [] Chills Cardiac:  [] Chest pain   [] Chest pressure   [] Palpitations   [] Shortness of breath when laying flat   [] Shortness of breath with exertion. Vascular:  [x] Pain in legs with walking   [] Pain in legs at rest  [] History of DVT   [] Phlebitis   [] Swelling in legs   [] Varicose veins   [] Non-healing ulcers Pulmonary:   [] Uses home oxygen   [] Productive cough   [] Hemoptysis   [] Wheeze  [] COPD   [] Asthma Neurologic:  [] Dizziness   [] Seizures   [] History of stroke   [] History of TIA  [] Aphasia   [] Vissual changes   [] Weakness or numbness in arm   [] Weakness or numbness in leg Musculoskeletal:   [] Joint swelling   [] Joint pain   [] Low back pain Hematologic:  [] Easy bruising  [] Easy bleeding   [] Hypercoagulable state   [] Anemic Gastrointestinal:  [] Diarrhea   [] Vomiting  [] Gastroesophageal reflux/heartburn   [] Difficulty swallowing. Genitourinary:  [] Chronic kidney disease   [] Difficult urination  [] Frequent urination   [] Blood in urine Skin:  [] Rashes   [] Ulcers  Psychological:  [] History of anxiety   []  History of major depression.  Physical Examination  There were no vitals filed for this visit. There is no height or weight on file to calculate BMI. Gen: WD/WN, NAD Head: [place]/AT, No temporalis wasting.  Ear/Nose/Throat: Hearing grossly intact, nares w/o erythema or drainage Eyes: PER, EOMI, sclera nonicteric.  Neck: Supple, no masses.  No bruit or JVD.  Pulmonary:  Good air movement, no audible wheezing, no use of accessory muscles.  Cardiac: RRR, normal S1, S2, no Murmurs. Vascular:  carotid bruit noted Vessel Right Left  Radial Palpable Palpable  Carotid  Palpable  Palpable  Subclav  Palpable Palpable  Gastrointestinal: soft, non-distended. No guarding/no peritoneal signs.  Musculoskeletal: M/S 5/5 throughout.  No visible deformity.  Neurologic: CN 2-12 intact. Pain and light touch intact in extremities.  Symmetrical.  Speech is fluent. Motor exam as listed above. Psychiatric: Judgment intact, Mood & affect  appropriate for pt's clinical situation. Dermatologic: No rashes or ulcers noted.  No changes consistent with cellulitis.   CBC Lab Results  Component Value Date   WBC 10.7 (H) 07/13/2021   HGB 11.8 (L) 07/13/2021   HCT 34.5 (L) 07/13/2021   MCV 87.1 07/13/2021   PLT 214 07/13/2021    BMET    Component Value Date/Time   NA 138 07/13/2021 0327   K 4.6 07/13/2021 0327   CL 107 07/13/2021 0327   CO2 23 07/13/2021 0327   GLUCOSE 169 (H) 07/13/2021 0327   BUN 28 (H) 07/13/2021 0327   CREATININE 1.31 (H) 07/13/2021 0327   CALCIUM 8.5 (L) 07/13/2021 0327   GFRNONAA 52 (L) 07/13/2021 0327   CrCl cannot be calculated (Patient's most recent lab result is older than the maximum 21 days allowed.).  COAG Lab Results  Component Value Date   INR 1.0 05/27/2021    Radiology  No results found.   Assessment/Plan There are no diagnoses linked to this encounter.   [name], MD  05/01/2022 12:06 PM

## 2022-05-03 ENCOUNTER — Ambulatory Visit (INDEPENDENT_AMBULATORY_CARE_PROVIDER_SITE_OTHER): Payer: Medicare PPO | Admitting: Vascular Surgery

## 2022-05-03 ENCOUNTER — Ambulatory Visit (INDEPENDENT_AMBULATORY_CARE_PROVIDER_SITE_OTHER): Payer: Medicare PPO

## 2022-05-03 ENCOUNTER — Encounter (INDEPENDENT_AMBULATORY_CARE_PROVIDER_SITE_OTHER): Payer: Self-pay | Admitting: Vascular Surgery

## 2022-05-03 VITALS — BP 111/65 | HR 78 | Resp 17 | Ht 62.0 in | Wt 166.8 lb

## 2022-05-03 DIAGNOSIS — I7143 Infrarenal abdominal aortic aneurysm, without rupture: Secondary | ICD-10-CM

## 2022-05-03 DIAGNOSIS — E1122 Type 2 diabetes mellitus with diabetic chronic kidney disease: Secondary | ICD-10-CM

## 2022-05-03 DIAGNOSIS — N183 Chronic kidney disease, stage 3 unspecified: Secondary | ICD-10-CM

## 2022-05-03 DIAGNOSIS — I10 Essential (primary) hypertension: Secondary | ICD-10-CM | POA: Diagnosis not present

## 2022-05-03 DIAGNOSIS — I6521 Occlusion and stenosis of right carotid artery: Secondary | ICD-10-CM

## 2022-05-03 DIAGNOSIS — I700 Atherosclerosis of aorta: Secondary | ICD-10-CM

## 2022-05-03 DIAGNOSIS — E781 Pure hyperglyceridemia: Secondary | ICD-10-CM

## 2022-05-03 DIAGNOSIS — I6523 Occlusion and stenosis of bilateral carotid arteries: Secondary | ICD-10-CM | POA: Diagnosis not present

## 2022-05-07 ENCOUNTER — Encounter (INDEPENDENT_AMBULATORY_CARE_PROVIDER_SITE_OTHER): Payer: Self-pay

## 2022-09-12 ENCOUNTER — Other Ambulatory Visit: Payer: Self-pay | Admitting: Internal Medicine

## 2022-09-12 DIAGNOSIS — R911 Solitary pulmonary nodule: Secondary | ICD-10-CM

## 2022-09-12 DIAGNOSIS — I10 Essential (primary) hypertension: Secondary | ICD-10-CM

## 2022-09-26 ENCOUNTER — Ambulatory Visit
Admission: RE | Admit: 2022-09-26 | Discharge: 2022-09-26 | Disposition: A | Discharge status: Home / Self Care | Payer: Medicare PPO | Source: Ambulatory Visit | Attending: Internal Medicine | Admitting: Internal Medicine

## 2022-09-26 DIAGNOSIS — R911 Solitary pulmonary nodule: Secondary | ICD-10-CM | POA: Diagnosis present

## 2022-09-26 DIAGNOSIS — I10 Essential (primary) hypertension: Secondary | ICD-10-CM

## 2023-01-04 ENCOUNTER — Other Ambulatory Visit: Payer: Self-pay | Admitting: Student

## 2023-01-04 DIAGNOSIS — Z8673 Personal history of transient ischemic attack (TIA), and cerebral infarction without residual deficits: Secondary | ICD-10-CM

## 2023-01-04 DIAGNOSIS — I69319 Unspecified symptoms and signs involving cognitive functions following cerebral infarction: Secondary | ICD-10-CM

## 2023-01-15 ENCOUNTER — Ambulatory Visit
Admission: RE | Admit: 2023-01-15 | Discharge: 2023-01-15 | Disposition: A | Discharge status: Home / Self Care | Payer: Medicare PPO | Source: Ambulatory Visit | Attending: Student | Admitting: Student

## 2023-01-15 DIAGNOSIS — Z8673 Personal history of transient ischemic attack (TIA), and cerebral infarction without residual deficits: Secondary | ICD-10-CM

## 2023-01-15 DIAGNOSIS — I69319 Unspecified symptoms and signs involving cognitive functions following cerebral infarction: Secondary | ICD-10-CM

## 2023-05-01 DIAGNOSIS — I712 Thoracic aortic aneurysm, without rupture, unspecified: Secondary | ICD-10-CM | POA: Insufficient documentation

## 2023-05-01 NOTE — Progress Notes (Unsigned)
MRN : [id number]  Carlos [name]. is a 89 y.o. (1930-10-21) male who presents with chief complaint of check carotid arteries.  History of Present Illness:  The patient is seen for follow up evaluation of carotid stenosis. The carotid stenosis followed by ultrasound.  The patient underwent a right carotid endarterectomy on 07/12/2021.   The patient denies amaurosis fugax. There is no recent history of TIA symptoms or focal motor deficits. There is no prior documented CVA.   The patient is taking enteric-coated aspirin 81 mg daily.   There is no history of migraine headaches. There is no history of seizures.   The patient is also here for surveillance of a known abdominal aortic aneurysm.  Aneurysm was incidentally found Dec 02, 2019 on a scan for a stone.  By report the infrarenal abdominal aortic aneurysm measuring up to 3.2 cm.  Patient denies unusual or abrupt abdominal pain or back pain, no other abdominal complaints. No changes suggesting embolic episodes.    There have been no interval changes in the patient's vascular health care since his last visit.   There is no history of claudication but no rest pain symptoms of the lower extremities. The patient denies angina or shortness of breath.    Carotid Duplex done today shows 1 to 39% stenosis bilaterally.   Left vertebral artery appears patent with antegrade flow.  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  CT of the chest without contrast dated September 26, 2022 is reviewed by me with the patient and his daughter.  It demonstrates a 40 mm ascending aortic aneurysm in association with a 42 mm descending thoracic aortic aneurysm.  An AAA found Dec 02, 2019 on a scan for a stone measured 3.2 cm in maximal diameter  No outpatient medications have been marked as taking for the 05/02/23 encounter (Appointment) with Gilda Crease, Latina Craver, MD.    Past Medical  History:  Diagnosis Date   AAA (abdominal aortic aneurysm) without rupture (HCC) 12/02/2019   a.) CT 12/02/2019: measured 3.2 cm   Aortic atherosclerosis (HCC)    BPH (benign prostatic hyperplasia)    Chronic renal insufficiency    Dyspnea    Full dentures    History of kidney stones    Hypertension    Hypertriglyceridemia 01/18/2014   Long term current use of antithrombotics/antiplatelets    (Per vascular surgery) a.) DAPT therapy (ASA + clopidogrel)   T2DM (type 2 diabetes mellitus) (HCC)     Past Surgical History:  Procedure Laterality Date   CARPAL TUNNEL RELEASE Right 11/12/2016   Procedure: CARPAL TUNNEL RELEASE;  Surgeon: Erin Sons, MD;  Location: [facility];  Service: Orthopedics;  Laterality: Right;   CARPAL TUNNEL RELEASE Left 08/09/2017   Procedure: OPEN CARPAL TUNNEL RELEASE;  Surgeon: Erin Sons, MD;  Location: [facility];  Service: Orthopedics;  Laterality: Left;  Diabetic - oral meds   CATARACT EXTRACTION W/ INTRAOCULAR LENS  IMPLANT, BILATERAL     ENDARTERECTOMY Right 07/12/2021   Procedure: ENDARTERECTOMY CAROTID;  Surgeon: Renford Dills, MD;  Location: [facility];  Service: Vascular;  Laterality: Right;   KNEE ARTHROPLASTY Left 02/01/2021   Procedure: COMPUTER ASSISTED TOTAL KNEE ARTHROPLASTY;  Surgeon: Donato Heinz, MD;  Location: [facility];  Service: Orthopedics;  Laterality: Left;   LITHOTRIPSY  09/2018  at [facility]   testical  Right 12/2019   TONSILLECTOMY  1960   TRANSTHORACIC ECHOCARDIOGRAM  05/2021   EF 50 to 55%.  Unable to fully assess wall motion.  GR 1 DD.  Normal RV.  Aortic sclerosis but no stenosis.  Normal mitral valve.  Normal atrial sizes.    Social History Social History   Tobacco Use   Smoking status: Never   Smokeless tobacco: Never  Vaping Use   Vaping status: Never Used  Substance Use Topics   Alcohol use: Not Currently    Comment: may have a beer a few times each year   Drug use: Never    Family  History Family History  Problem Relation Age of Onset   Coronary artery disease Father    Colon cancer Father    Colon cancer Brother     No Known Allergies   REVIEW OF SYSTEMS (Negative unless checked)  Constitutional: [] Weight loss  [] Fever  [] Chills Cardiac: [] Chest pain   [] Chest pressure   [] Palpitations   [] Shortness of breath when laying flat   [] Shortness of breath with exertion. Vascular:  [] Pain in legs with walking   [] Pain in legs at rest  [] History of DVT   [] Phlebitis   [] Swelling in legs   [] Varicose veins   [] Non-healing ulcers Pulmonary:   [] Uses home oxygen   [] Productive cough   [] Hemoptysis   [] Wheeze  [] COPD   [] Asthma Neurologic:  [] Dizziness   [] Seizures   [x] History of stroke   [] History of TIA  [] Aphasia   [] Vissual changes   [] Weakness or numbness in arm   [] Weakness or numbness in leg Musculoskeletal:   [] Joint swelling   [] Joint pain   [] Low back pain Hematologic:  [] Easy bruising  [] Easy bleeding   [] Hypercoagulable state   [] Anemic Gastrointestinal:  [] Diarrhea   [] Vomiting  [] Gastroesophageal reflux/heartburn   [] Difficulty swallowing. Genitourinary:  [] Chronic kidney disease   [] Difficult urination  [] Frequent urination   [] Blood in urine Skin:  [] Rashes   [] Ulcers  Psychological:  [] History of anxiety   []  History of major depression.  Physical Examination  There were no vitals filed for this visit. There is no height or weight on file to calculate BMI. Gen: WD/WN, NAD Head: [place]/AT, No temporalis wasting.  Ear/Nose/Throat: Hearing grossly intact, nares w/o erythema or drainage Eyes: PER, EOMI, sclera nonicteric.  Neck: Supple, no masses.  No bruit or JVD.  Pulmonary:  Good air movement, no audible wheezing, no use of accessory muscles.  Cardiac: RRR, normal S1, S2, no Murmurs. Vascular:  carotid bruit noted Vessel Right Left  Radial Palpable Palpable  Carotid  Palpable  Palpable  Gastrointestinal: soft, non-distended. No guarding/no peritoneal  signs.  Musculoskeletal: M/S 5/5 throughout.  No visible deformity.  Neurologic: CN 2-12 intact. Pain and light touch intact in extremities.  Symmetrical.  Speech is fluent. Motor exam as listed above. Psychiatric: Judgment intact, Mood & affect appropriate for pt's clinical situation. Dermatologic: No rashes or ulcers noted.  No changes consistent with cellulitis.   CBC Lab Results  Component Value Date   WBC 10.7 (H) 07/13/2021   HGB 11.8 (L) 07/13/2021   HCT 34.5 (L) 07/13/2021   MCV 87.1 07/13/2021   PLT 214 07/13/2021    BMET    Component Value Date/Time   NA 138 07/13/2021 0327   K 4.6 07/13/2021 0327   CL 107 07/13/2021 0327   CO2 23 07/13/2021 0327   GLUCOSE 169 (H) 07/13/2021 0327   BUN  28 (H) 07/13/2021 0327   CREATININE 1.31 (H) 07/13/2021 0327   CALCIUM 8.5 (L) 07/13/2021 0327   GFRNONAA 52 (L) 07/13/2021 0327   CrCl cannot be calculated (Patient's most recent lab result is older than the maximum 21 days allowed.).  COAG Lab Results  Component Value Date   INR 1.0 05/27/2021    Radiology No results found.   Assessment/Plan 1. Carotid stenosis, symptomatic w/o infarct, right Recommend:   Given the patient's asymptomatic subcritical stenosis no further invasive testing or surgery at this time.   Carotid Duplex done today shows 1 to 39% stenosis bilaterally.   Left vertebral artery appears patent with antegrade flow.  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   Continue antiplatelet therapy as prescribed Continue management of CAD, HTN and Hyperlipidemia Healthy heart diet,  encouraged exercise at least 4 times per week.  Follow up in 12 months with duplex ultrasound and physical exam    2. Infrarenal abdominal aortic aneurysm (AAA) without rupture (HCC) Recommend: No surgery or intervention is indicated at this time.  The patient has an asymptomatic abdominal aortic aneurysm that is less than  4 cm in maximal diameter.    I have reviewed the natural history of abdominal aortic aneurysm and the small risk of rupture for aneurysm less than 5 cm in size.  However, as these small aneurysms tend to enlarge over time, continued surveillance with ultrasound or CT scan is mandatory.   I have also discussed optimizing medical management with hypertension and lipid control and the negative effect that any tobacco products have on aneurysmal disease.  The patient is also encouraged to exercise a minimum of 30 minutes 4 times a week.   Should the patient develop new onset abdominal or back pain or signs of peripheral embolization they are instructed to seek medical attention immediately and to alert the physician providing care that they have an aneurysm.   The patient voices their understanding.  The patient will return in 12 months with an aortic duplex.  3. Thoracic aortic aneurysm without rupture, unspecified part (HCC) Recommend:  No surgery or intervention is indicated at this time.  The patient has an asymptomatic thoracic aortic aneurysm that is less than 6.0 cm in maximal diameter.  I have discussed the natural history of thoracic aortic aneurysm and the small risk of rupture for aneurysm less than 6.5 cm in size.  However, as these small aneurysms tend to enlarge over time, continued surveillance with CT scan is mandatory.   I have also discussed optimizing medical management with hypertension and lipid control and the negative effect that any tobacco products have on aneurysmal disease.  The patient is also encouraged to exercise a minimum of 30 minutes 4 times a week.   Should the patient develop new onset chest or back pain or signs of peripheral embolization they are instructed to seek medical attention immediately and to alert the physician providing care that they have an aneurysm in the chest.   The patient voices their understanding.  The patient will return as ordered  with a CT scan of the chest  4. Essential hypertension Continue antihypertensive medications as already ordered, these medications have been reviewed and there are no changes at this time.  5. Controlled type 2 diabetes mellitus with stage 3 chronic kidney disease, without long-term current use of insulin (HCC) Continue hypoglycemic medications as already ordered, these medications have been reviewed and there are no  changes at this time.  Hgb A1C to be monitored as already arranged by primary service.  The patient also has moderate renal disease associated with his diabetes.  However, at the present time the patient is not yet on dialysis.  Avoid nephrotoxic medications and dehydration.  Further plans per nephrology  6. Hypertriglyceridemia Continue statin as ordered and reviewed, no changes at this time    Levora Dredge, MD  05/01/2023 8:39 AM

## 2023-05-02 ENCOUNTER — Ambulatory Visit (INDEPENDENT_AMBULATORY_CARE_PROVIDER_SITE_OTHER): Payer: Medicare PPO

## 2023-05-02 ENCOUNTER — Encounter (INDEPENDENT_AMBULATORY_CARE_PROVIDER_SITE_OTHER): Payer: Self-pay | Admitting: Vascular Surgery

## 2023-05-02 ENCOUNTER — Ambulatory Visit (INDEPENDENT_AMBULATORY_CARE_PROVIDER_SITE_OTHER): Payer: Medicare PPO | Admitting: Vascular Surgery

## 2023-05-02 VITALS — BP 108/66 | HR 80 | Resp 16 | Wt 165.6 lb

## 2023-05-02 DIAGNOSIS — E1122 Type 2 diabetes mellitus with diabetic chronic kidney disease: Secondary | ICD-10-CM

## 2023-05-02 DIAGNOSIS — I6521 Occlusion and stenosis of right carotid artery: Secondary | ICD-10-CM

## 2023-05-02 DIAGNOSIS — I712 Thoracic aortic aneurysm, without rupture, unspecified: Secondary | ICD-10-CM | POA: Diagnosis not present

## 2023-05-02 DIAGNOSIS — I7143 Infrarenal abdominal aortic aneurysm, without rupture: Secondary | ICD-10-CM | POA: Diagnosis not present

## 2023-05-02 DIAGNOSIS — I10 Essential (primary) hypertension: Secondary | ICD-10-CM | POA: Diagnosis not present

## 2023-05-02 DIAGNOSIS — E781 Pure hyperglyceridemia: Secondary | ICD-10-CM

## 2023-05-02 DIAGNOSIS — N183 Chronic kidney disease, stage 3 unspecified: Secondary | ICD-10-CM

## 2023-11-26 ENCOUNTER — Encounter (INDEPENDENT_AMBULATORY_CARE_PROVIDER_SITE_OTHER): Payer: Self-pay

## 2024-04-28 ENCOUNTER — Other Ambulatory Visit (INDEPENDENT_AMBULATORY_CARE_PROVIDER_SITE_OTHER): Payer: Self-pay | Admitting: Vascular Surgery

## 2024-04-28 DIAGNOSIS — I7143 Infrarenal abdominal aortic aneurysm, without rupture: Secondary | ICD-10-CM

## 2024-04-28 DIAGNOSIS — I6521 Occlusion and stenosis of right carotid artery: Secondary | ICD-10-CM

## 2024-04-30 ENCOUNTER — Other Ambulatory Visit (INDEPENDENT_AMBULATORY_CARE_PROVIDER_SITE_OTHER): Payer: Medicare PPO

## 2024-04-30 ENCOUNTER — Ambulatory Visit (INDEPENDENT_AMBULATORY_CARE_PROVIDER_SITE_OTHER): Payer: Medicare PPO

## 2024-04-30 ENCOUNTER — Ambulatory Visit (INDEPENDENT_AMBULATORY_CARE_PROVIDER_SITE_OTHER): Payer: Medicare PPO | Admitting: Vascular Surgery

## 2024-04-30 ENCOUNTER — Encounter (INDEPENDENT_AMBULATORY_CARE_PROVIDER_SITE_OTHER): Payer: Self-pay | Admitting: Vascular Surgery

## 2024-04-30 VITALS — BP 107/66 | HR 65 | Resp 16 | Ht 64.0 in | Wt 163.4 lb

## 2024-04-30 DIAGNOSIS — N183 Chronic kidney disease, stage 3 unspecified: Secondary | ICD-10-CM

## 2024-04-30 DIAGNOSIS — I10 Essential (primary) hypertension: Secondary | ICD-10-CM | POA: Diagnosis not present

## 2024-04-30 DIAGNOSIS — I7143 Infrarenal abdominal aortic aneurysm, without rupture: Secondary | ICD-10-CM

## 2024-04-30 DIAGNOSIS — I712 Thoracic aortic aneurysm, without rupture, unspecified: Secondary | ICD-10-CM

## 2024-04-30 DIAGNOSIS — I6521 Occlusion and stenosis of right carotid artery: Secondary | ICD-10-CM

## 2024-04-30 DIAGNOSIS — I63239 Cerebral infarction due to unspecified occlusion or stenosis of unspecified carotid arteries: Secondary | ICD-10-CM

## 2024-04-30 DIAGNOSIS — E1122 Type 2 diabetes mellitus with diabetic chronic kidney disease: Secondary | ICD-10-CM

## 2024-04-30 NOTE — Progress Notes (Signed)
 MRN : [id number]  Carlos [name]. is a 89 y.o. (09/05/1930) male who presents with chief complaint of check circulation.  History of Present Illness:   The patient is seen for follow up evaluation of carotid stenosis. The carotid stenosis followed by ultrasound.  The patient underwent a right carotid endarterectomy on 07/12/2021.   The patient denies amaurosis fugax. There is no recent history of TIA symptoms or focal motor deficits. There is no prior documented CVA.   The patient is taking enteric-coated aspirin  81 mg daily.   There is no history of migraine headaches. There is no history of seizures.   The patient is also here for surveillance of a known abdominal aortic aneurysm.  Aneurysm was incidentally found Dec 02, 2019 on a scan for a stone.  By report the infrarenal abdominal aortic aneurysm measuring up to 3.2 cm.  Patient denies unusual or abrupt abdominal pain or back pain, no other abdominal complaints. No changes suggesting embolic episodes.    There have been no interval changes in the patient's vascular health care since his last visit.   There is no history of claudication but no rest pain symptoms of the lower extremities. The patient denies angina or shortness of breath.    Carotid Duplex done today shows 1 to 39% stenosis bilaterally.  Right CEA is widely patent.   Left vertebral artery appears patent with antegrade flow.  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   CT of the chest without contrast dated September 26, 2022 is reviewed by me with the patient and his daughter.  It demonstrates a 40 mm ascending aortic aneurysm in association with a 42 mm descending thoracic aortic aneurysm.   Duplex ultrasound of the abdominal aorta obtained today demonstrates a 3.1 cm abdominal aortic aneurysm no change compared to the previous radiologic  studies..  No outpatient medications have been marked as taking for the 04/30/24 encounter (Office Visit) with Jama, Cordella MATSU, MD.    Past Medical History:  Diagnosis Date   AAA (abdominal aortic aneurysm) without rupture 12/02/2019   a.) CT 12/02/2019: measured 3.2 cm   Aortic atherosclerosis    BPH (benign prostatic hyperplasia)    Chronic renal insufficiency    Dyspnea    Full dentures    History of kidney stones    Hypertension    Hypertriglyceridemia 01/18/2014   Long term current use of antithrombotics/antiplatelets    (Per vascular surgery) a.) DAPT therapy (ASA + clopidogrel )   T2DM (type 2 diabetes mellitus) (HCC)     Past Surgical History:  Procedure Laterality Date   CARPAL TUNNEL RELEASE Right 11/12/2016   Procedure: CARPAL TUNNEL RELEASE;  Surgeon: Maryl Barters, MD;  Location: [facility];  Service: Orthopedics;  Laterality: Right;   CARPAL TUNNEL RELEASE Left 08/09/2017   Procedure: OPEN CARPAL TUNNEL RELEASE;  Surgeon: Maryl Barters, MD;  Location: [facility];  Service: Orthopedics;  Laterality: Left;  Diabetic - oral meds   CATARACT EXTRACTION W/ INTRAOCULAR LENS  IMPLANT, BILATERAL     ENDARTERECTOMY Right  07/12/2021   Procedure: ENDARTERECTOMY CAROTID;  Surgeon: Jama Cordella MATSU, MD;  Location: [facility];  Service: Vascular;  Laterality: Right;   KNEE ARTHROPLASTY Left 02/01/2021   Procedure: COMPUTER ASSISTED TOTAL KNEE ARTHROPLASTY;  Surgeon: Mardee Lynwood SQUIBB, MD;  Location: [facility];  Service: Orthopedics;  Laterality: Left;   LITHOTRIPSY  09/2018   at [facility]   testical  Right 12/2019   TONSILLECTOMY  1960   TRANSTHORACIC ECHOCARDIOGRAM  05/2021   EF 50 to 55%.  Unable to fully assess wall motion.  GR 1 DD.  Normal RV.  Aortic sclerosis but no stenosis.  Normal mitral valve.  Normal atrial sizes.    Social History Social History   Tobacco Use   Smoking status: Never   Smokeless tobacco: Never  Vaping Use   Vaping status: Never Used   Substance Use Topics   Alcohol use: Not Currently    Comment: may have a beer a few times each year   Drug use: Never    Family History Family History  Problem Relation Age of Onset   Coronary artery disease Father    Colon cancer Father    Colon cancer Brother     No Known Allergies   REVIEW OF SYSTEMS (Negative unless checked)  Constitutional: [] Weight loss  [] Fever  [] Chills Cardiac: [] Chest pain   [] Chest pressure   [] Palpitations   [] Shortness of breath when laying flat   [] Shortness of breath with exertion. Vascular:  [x] Pain in legs with walking   [] Pain in legs at rest  [] History of DVT   [] Phlebitis   [] Swelling in legs   [] Varicose veins   [] Non-healing ulcers Pulmonary:   [] Uses home oxygen   [] Productive cough   [] Hemoptysis   [] Wheeze  [] COPD   [] Asthma Neurologic:  [] Dizziness   [] Seizures   [] History of stroke   [] History of TIA  [] Aphasia   [] Vissual changes   [] Weakness or numbness in arm   [] Weakness or numbness in leg Musculoskeletal:   [] Joint swelling   [] Joint pain   [] Low back pain Hematologic:  [] Easy bruising  [] Easy bleeding   [] Hypercoagulable state   [] Anemic Gastrointestinal:  [] Diarrhea   [] Vomiting  [] Gastroesophageal reflux/heartburn   [] Difficulty swallowing. Genitourinary:  [] Chronic kidney disease   [] Difficult urination  [] Frequent urination   [] Blood in urine Skin:  [] Rashes   [] Ulcers  Psychological:  [] History of anxiety   []  History of major depression.  Physical Examination  Vitals:   04/30/24 0821  BP: 107/66  Pulse: 65  Resp: 16  Weight: 163 lb 6.4 oz (74.1 kg)  Height: 5' 4 (1.626 m)   Body mass index is 28.05 kg/m. Gen: WD/WN, NAD Head: [place]/AT, No temporalis wasting.  Ear/Nose/Throat: Hearing grossly intact, nares w/o erythema or drainage Eyes: PER, EOMI, sclera nonicteric.  Neck: Supple, no masses.  No bruit or JVD.  Pulmonary:  Good air movement, no audible wheezing, no use of accessory muscles.  Cardiac: RRR, normal  S1, S2, no Murmurs. Vascular:  mild trophic changes, no open wounds Vessel Right Left  Radial Palpable Palpable  Gastrointestinal: soft, non-distended. No guarding/no peritoneal signs.  Musculoskeletal: M/S 5/5 throughout.  No visible deformity.  Neurologic: CN 2-12 intact. Pain and light touch intact in extremities.  Symmetrical.  Speech is fluent. Motor exam as listed above. Psychiatric: Judgment intact, Mood & affect appropriate for pt's clinical situation. Dermatologic: No rashes or ulcers noted.  No changes consistent with cellulitis.   CBC Lab Results  Component Value  Date   WBC 10.7 (H) 07/13/2021   HGB 11.8 (L) 07/13/2021   HCT 34.5 (L) 07/13/2021   MCV 87.1 07/13/2021   PLT 214 07/13/2021    BMET    Component Value Date/Time   NA 138 07/13/2021 0327   K 4.6 07/13/2021 0327   CL 107 07/13/2021 0327   CO2 23 07/13/2021 0327   GLUCOSE 169 (H) 07/13/2021 0327   BUN 28 (H) 07/13/2021 0327   CREATININE 1.31 (H) 07/13/2021 0327   CALCIUM  8.5 (L) 07/13/2021 0327   GFRNONAA 52 (L) 07/13/2021 0327   CrCl cannot be calculated (Patient's most recent lab result is older than the maximum 21 days allowed.).  COAG Lab Results  Component Value Date   INR 1.0 05/27/2021    Radiology No results found.   Assessment/Plan 1. Carotid artery stenosis with cerebral infarction (HCC) (Primary) Recommend:   Given the patient's asymptomatic subcritical stenosis no further invasive testing or surgery at this time.   Carotid Duplex done today shows 1 to 39% stenosis bilaterally.   Left vertebral artery appears patent with antegrade flow.  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   Continue antiplatelet therapy as prescribed Continue management of CAD, HTN and Hyperlipidemia Healthy heart diet,  encouraged exercise at least 4 times per week.   Follow up in 12 months with duplex ultrasound and physical exam   - VAS US  CAROTID;  Future  2. Infrarenal abdominal aortic aneurysm (AAA) without rupture Recommend: No surgery or intervention is indicated at this time.   The patient has an asymptomatic abdominal aortic aneurysm that is less than 4 cm in maximal diameter.     I have reviewed the natural history of abdominal aortic aneurysm and the small risk of rupture for aneurysm less than 5 cm in size.   However, as these small aneurysms tend to enlarge over time, continued surveillance with ultrasound or CT scan is mandatory.    I have also discussed optimizing medical management with hypertension and lipid control and the negative effect that any tobacco products have on aneurysmal disease.  The patient is also encouraged to exercise a minimum of 30 minutes 4 times a week.    Should the patient develop new onset abdominal or back pain or signs of peripheral embolization they are instructed to seek medical attention immediately and to alert the physician providing care that they have an aneurysm.    The patient voices their understanding.   The patient will undergo duplex ultrasound surveillance on a 24 to 71-month frequency.  This is based on the small aneurysm size and its stability over the past years  3. Thoracic aortic aneurysm without rupture, unspecified part Recommend:   No surgery or intervention is indicated at this time.   The patient has an asymptomatic thoracic aortic aneurysm that is less than 6.0 cm in maximal diameter.  I have discussed the natural history of thoracic aortic aneurysm and the small risk of rupture for aneurysm less than 6.5 cm in size.  However, as these small aneurysms tend to enlarge over time, continued surveillance with CT scan is mandatory.    I have also discussed optimizing medical management with hypertension and lipid control and the negative effect that any tobacco products have on aneurysmal disease.  The patient is also encouraged to exercise a minimum of 30 minutes 4 times a  week.    Should the patient develop new onset chest or back  pain or signs of peripheral embolization they are instructed to seek medical attention immediately and to alert the physician providing care that they have an aneurysm in the chest.    The patient voices their understanding.   The patient will have a CT scan of the chest approximately every 3 to 5 years based on the small size of his thoracic aneurysm  4. Essential hypertension Continue antihypertensive medications as already ordered, these medications have been reviewed and there are no changes at this time.  5. Controlled type 2 diabetes mellitus with stage 3 chronic kidney disease, without long-term current use of insulin  (HCC) Continue hypoglycemic medications as already ordered, these medications have been reviewed and there are no changes at this time.  Hgb A1C to be monitored as already arranged by primary service  Additionally, the patient has advanced renal disease.  However, at the present time the patient is not yet on dialysis.  Avoid nephrotoxic medications and dehydration.  Further plans per nephrology    Cordella Shawl, MD  04/30/2024 8:37 AM

## 2025-04-29 ENCOUNTER — Encounter (INDEPENDENT_AMBULATORY_CARE_PROVIDER_SITE_OTHER)

## 2025-04-29 ENCOUNTER — Ambulatory Visit (INDEPENDENT_AMBULATORY_CARE_PROVIDER_SITE_OTHER): Admitting: Vascular Surgery
